# Patient Record
Sex: Female | Born: 1950 | Race: White | Hispanic: No | Smoking: Current every day smoker
Health system: Southern US, Community
[De-identification: ages and names within clinical notes are randomized; demographics above are authoritative.]

## PROBLEM LIST (undated history)

## (undated) DIAGNOSIS — F32A Depression, unspecified: Secondary | ICD-10-CM

## (undated) DIAGNOSIS — F419 Anxiety disorder, unspecified: Secondary | ICD-10-CM

## (undated) DIAGNOSIS — F329 Major depressive disorder, single episode, unspecified: Secondary | ICD-10-CM

---

## 2008-04-09 ENCOUNTER — Ambulatory Visit (HOSPITAL_COMMUNITY): Admission: RE | Admit: 2008-04-09 | Discharge: 2008-04-09 | Payer: Self-pay | Admitting: Specialist

## 2008-06-19 ENCOUNTER — Ambulatory Visit (HOSPITAL_BASED_OUTPATIENT_CLINIC_OR_DEPARTMENT_OTHER): Admission: RE | Admit: 2008-06-19 | Discharge: 2008-06-19 | Payer: Self-pay | Admitting: Specialist

## 2011-02-17 NOTE — Op Note (Signed)
[name], Danielle Villanueva                  ACCOUNT NO.:  [id number]   MEDICAL RECORD NO.:  [id number]          PATIENT TYPE:  AMB   LOCATION:  [facility]                         FACILITY:  [facility]   PHYSICIAN:  Jene Every, M.D.    DATE OF BIRTH:  [birth year]   DATE OF PROCEDURE:  06/19/2008  DATE OF DISCHARGE:                               OPERATIVE REPORT   PREOPERATIVE DIAGNOSIS:  Medial meniscal tear of the left knee.   POSTOPERATIVE DIAGNOSIS:  Medial meniscal tear of the left knee.   PROCEDURE PERFORMED:  Left knee arthroscopy, posterior medial meniscectomy.   ANESTHESIA:  General.   ASSISTANT:  None.   BRIEF HISTORY/INDICATIONS:  This is a 60 year old female who has had mechanical symptoms from a left  knee injury with a medial meniscal tear causing locking, popping, and  giving way.  She was indicated for a posterior medial meniscectomy.  Preoperatively, the MRI had indicated possible bone marrow changes;  however, she is a smoker and had a normal CBC and differential.  Risks  and benefits of the procedure were discussed, including bleeding,  infection, damage to neurovascular structures, DVT, PE, and anesthetic  complications.   TECHNIQUE:  With the patient in supine position after the induction of adequate  general anesthesia and 1 gm of Kefzol, the left lower extremity was  prepped and draped in the usual sterile fashion.  A lateral parapatellar  portal and a superior medial parapatellar portal was fashioned with a  #11 blade, egress cannula atraumatically placed.  Irrigant was utilized  to insufflate the joint.  Under direct visualization, a medial  parapatellar portal was fashioned with a #11 blade after localization  with an 18 gauge needle, sparing the medial meniscus.  Noted was a  displaced medial meniscal tear.  Basket rongeur was utilized to amputate  the meniscus, which was still attached anteriorly.  This was then  removed with a pituitary.  The remainder of the  meniscus was then  contoured with a 42 coude shaver and a straight basket rongeur to a  stable pace.  This was of the medial compartment and involved the inner  third of the meniscus back to the posterior portion of the meniscus.  The remnant was stable.  The cartilage was unremarkable.  ACL and PCL  were unremarkable.  All compartments revealed normal plateau, femoral  condyle, and normal meniscus, stable to probe palpation without evidence  of tear.  In the suprapatellar pouch, there was normal patellofemoral  tracking.  There was no significant arthrosis noted in the  patellofemoral joint.  The medial compartment was re-examined again.  The remnant of the meniscus was stable to probe palpation and then  flexion and extension.  There was no interposition between the femoral  condyle on the tibia with full range noted.   The knee was copiously lavaged, and all instrumentation was removed.  The portals were closed with 4-0 nylon with simple sutures.  Marcaine  0.25% with epinephrine was infiltrated in the joint, and the wound was  dressed sterilely.  She was awakened without difficulty and transported  to the recovery room in satisfactory condition.   Patient tolerated the procedure well with no complications.   ASSISTANT:  None.      Jene Every, M.D.  Electronically Signed     JB/MEDQ  D:  06/19/2008  T:  06/20/2008  Job:  [id number]

## 2011-05-26 ENCOUNTER — Emergency Department (HOSPITAL_COMMUNITY): Payer: Self-pay

## 2011-05-26 ENCOUNTER — Emergency Department (HOSPITAL_COMMUNITY)
Admission: EM | Admit: 2011-05-26 | Discharge: 2011-05-26 | Disposition: A | Discharge status: Home / Self Care | Payer: No Typology Code available for payment source | Attending: Emergency Medicine | Admitting: Emergency Medicine

## 2011-05-26 DIAGNOSIS — M25569 Pain in unspecified knee: Secondary | ICD-10-CM | POA: Insufficient documentation

## 2011-05-26 DIAGNOSIS — Y9229 Other specified public building as the place of occurrence of the external cause: Secondary | ICD-10-CM | POA: Insufficient documentation

## 2011-05-26 DIAGNOSIS — M25559 Pain in unspecified hip: Secondary | ICD-10-CM | POA: Insufficient documentation

## 2011-05-26 DIAGNOSIS — W010XXA Fall on same level from slipping, tripping and stumbling without subsequent striking against object, initial encounter: Secondary | ICD-10-CM | POA: Insufficient documentation

## 2011-05-26 DIAGNOSIS — M549 Dorsalgia, unspecified: Secondary | ICD-10-CM | POA: Insufficient documentation

## 2011-07-06 LAB — POCT HEMOGLOBIN-HEMACUE: Hemoglobin: 13.6

## 2012-12-31 IMAGING — CR DG PELVIS 1-2V
1 series · 1 of 1 positions shown · non-contrast
Comparison: None.

CLINICAL DATA: Fall, low back pain

PELVIS - 1-2 VIEW

[t pelvis ap]
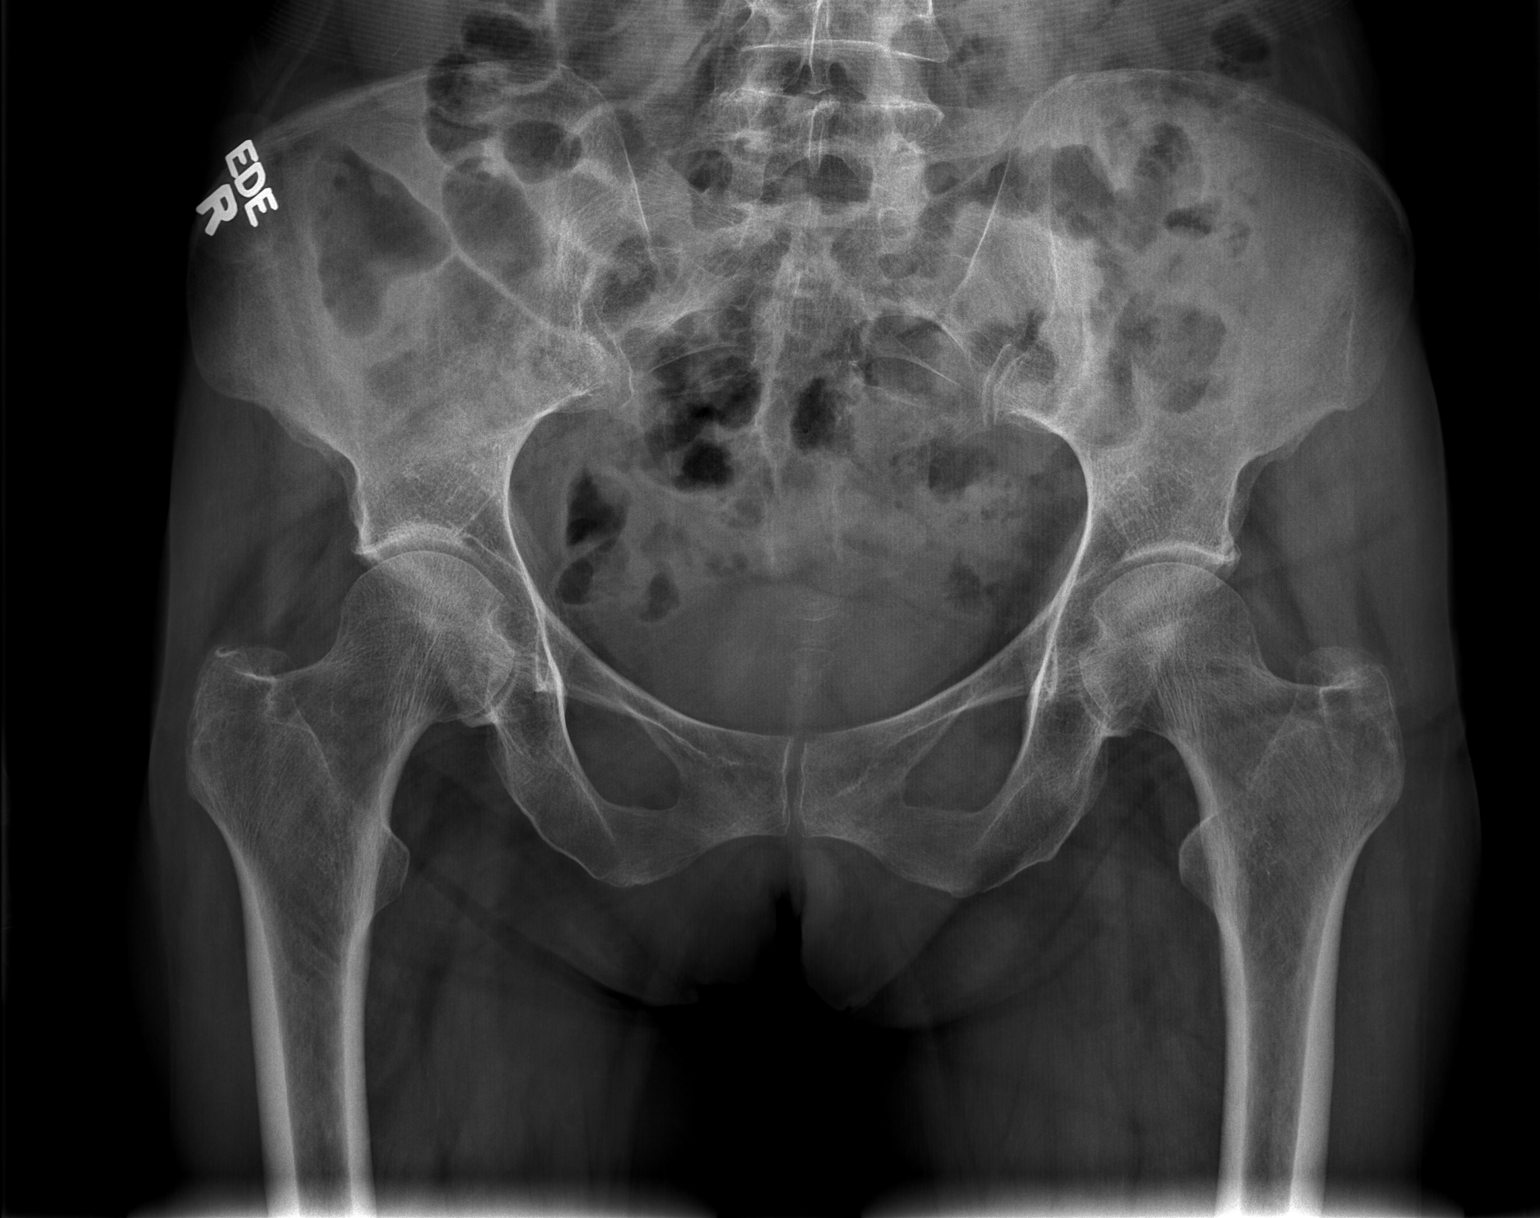

[1 of 1 positions shown; findings below may reference images not displayed]

FINDINGS: No fracture or dislocation is seen.

The bilateral hip joint spaces are symmetric.

Visualized lower lumbar spine appears unremarkable.
IMPRESSION: No fracture or dislocation is seen.

## 2012-12-31 IMAGING — CR DG LUMBAR SPINE COMPLETE 4+V
5 series · 5 of 5 positions shown · non-contrast
Comparison: None.

CLINICAL DATA: History of injury from fall with pain in the lower
back.

LUMBAR SPINE - COMPLETE 4+ VIEW

[t lumbar spine ap]
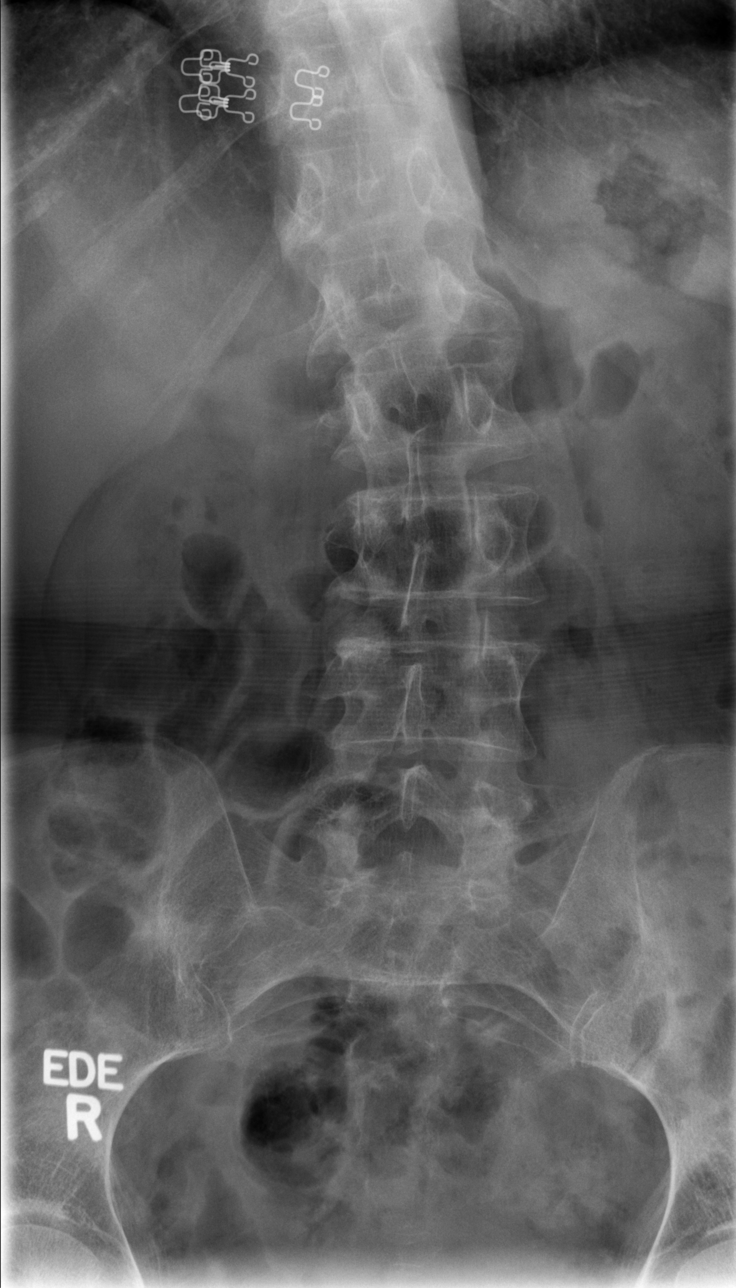

[t lumbar spine obl (1 of 2)]
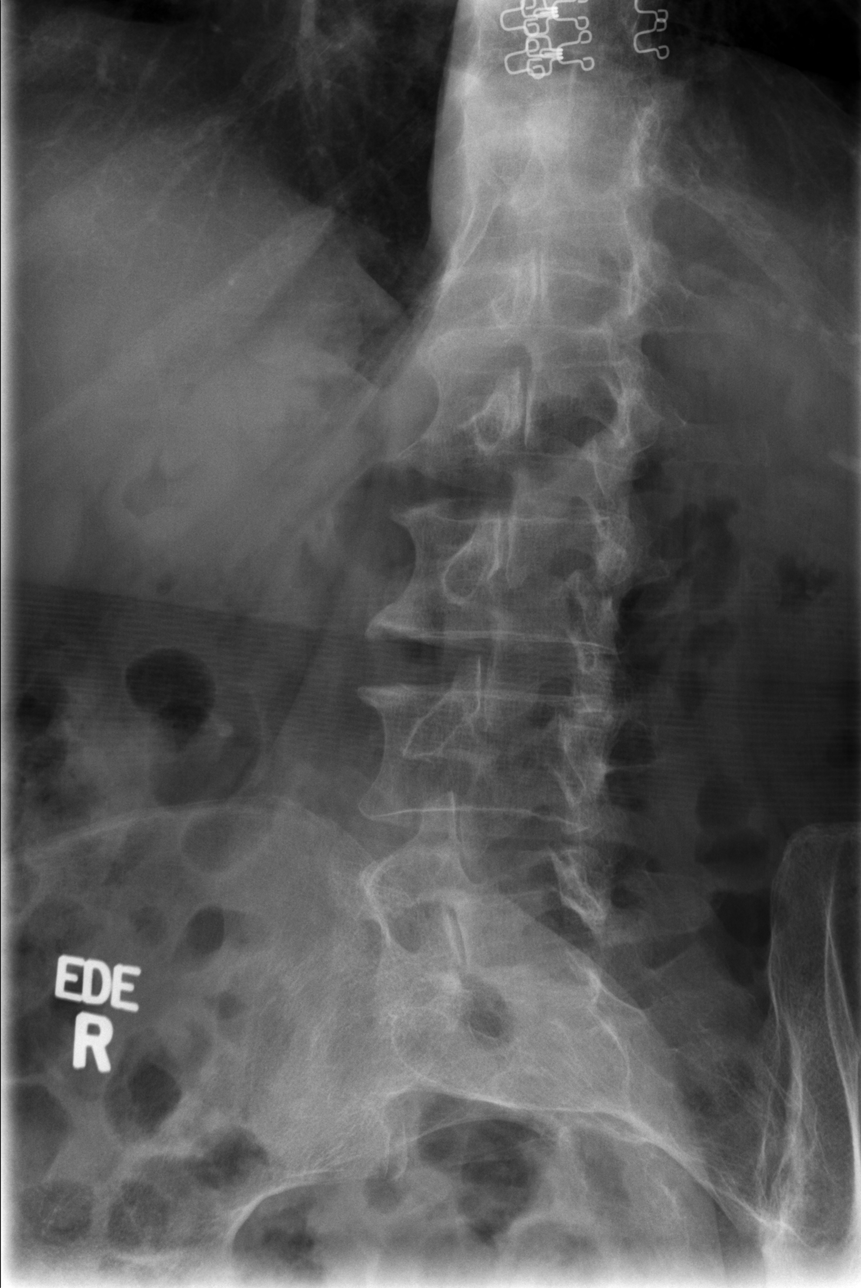

[t lumbar spine obl (2 of 2)]
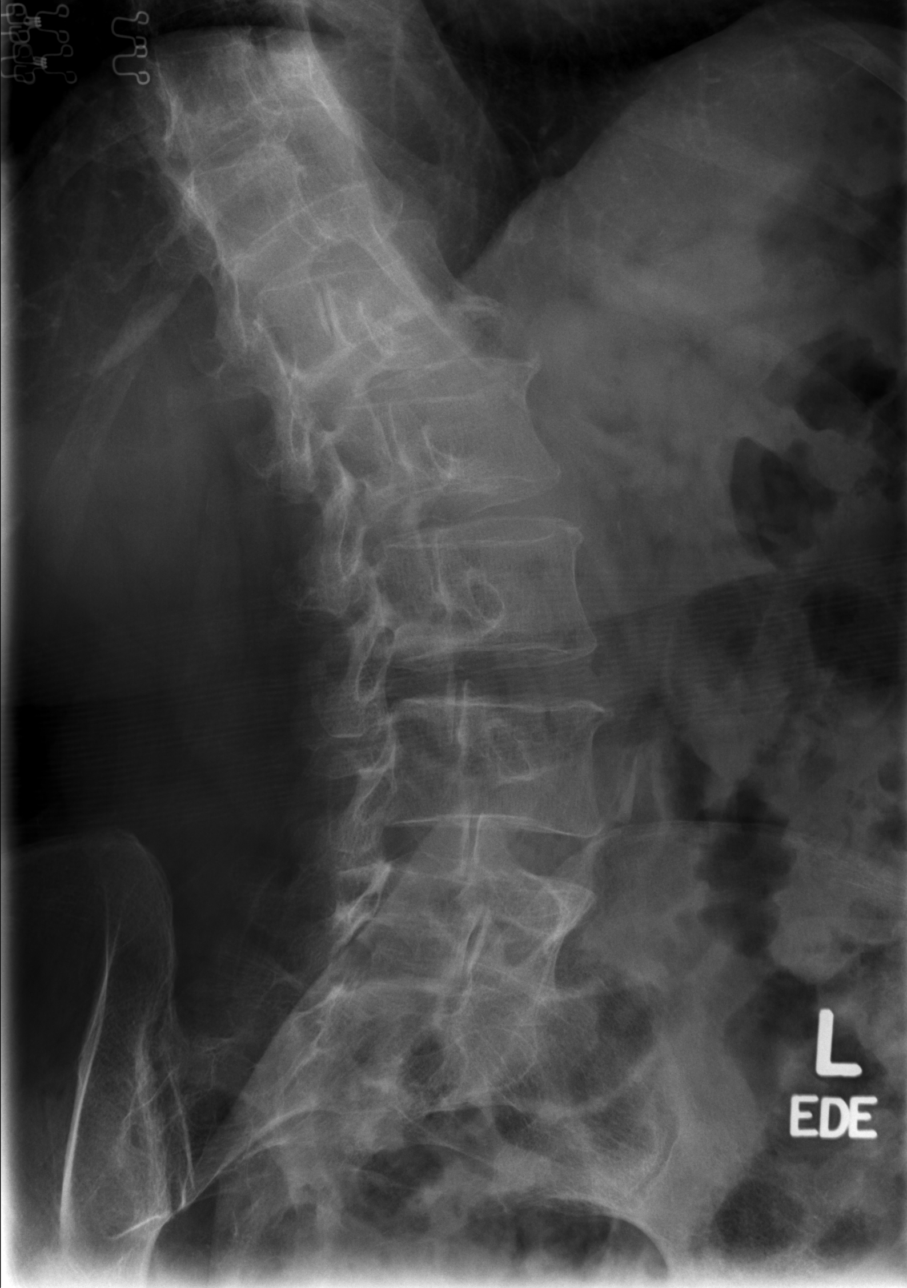

[t lumbar spine lat]
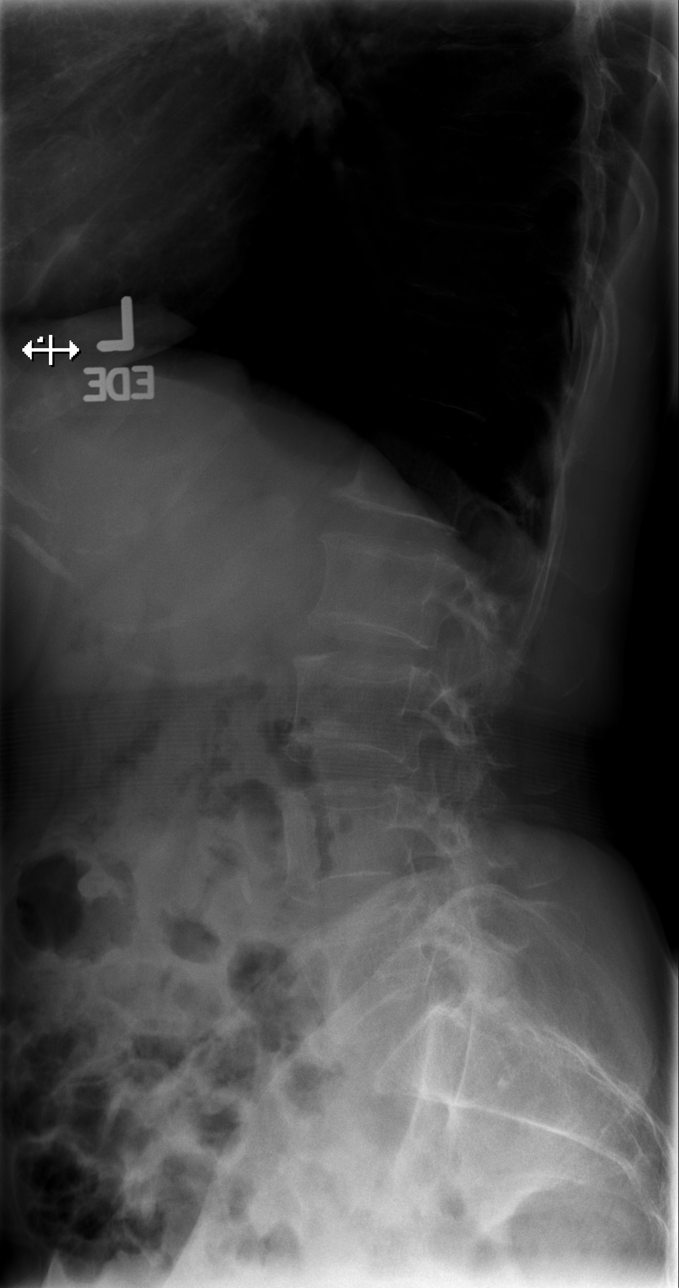

[t lumbar l-5 s-1 spot]
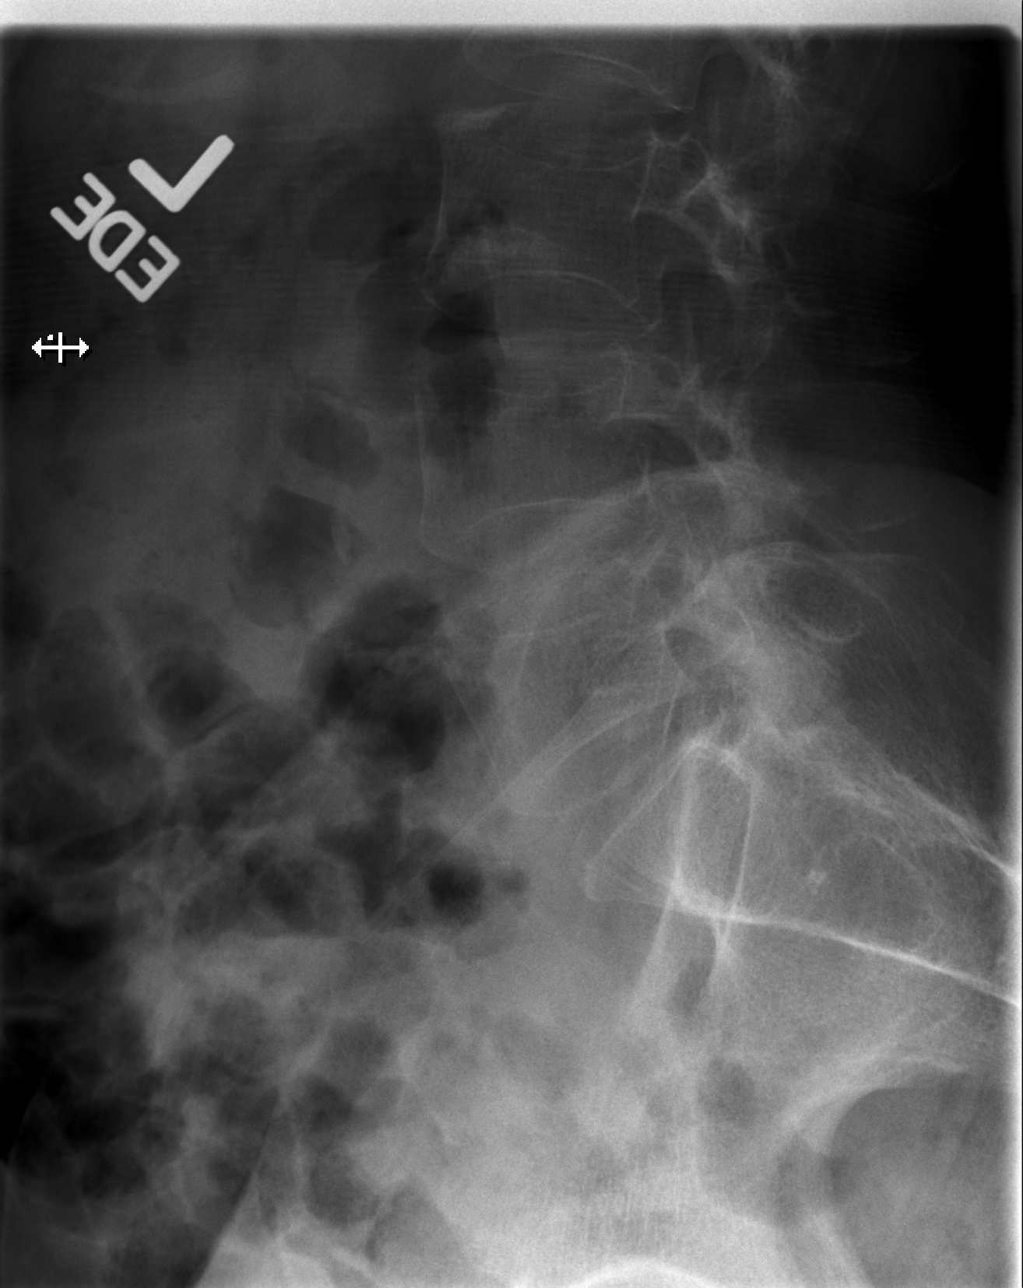

[5 of 5 positions shown; findings below may reference images not displayed]

FINDINGS: There are five non-rib bearing lumbar-type vertebral
bodies which are labeled L1-L5.  There is moderate scoliosis
convexity to the left.  There is an osteopenic appearance of the
bones.
No fracture, subluxation, pars defect, or bone destruction is seen.
Intervertebral disc spaces are maintained.  There is marginal
osteophyte formation representing degenerative spondylosis.  There
is apophyseal joint disease at L5-S1.  SI joints appear intact.
IMPRESSION: No fracture or dislocation is evident.  Scoliosis.  Degenerative
spondylosis.  Osteopenic appearance of bones.

## 2013-12-21 ENCOUNTER — Encounter (HOSPITAL_COMMUNITY): Payer: Self-pay | Admitting: Emergency Medicine

## 2013-12-21 ENCOUNTER — Emergency Department (HOSPITAL_COMMUNITY)
Admission: EM | Admit: 2013-12-21 | Discharge: 2013-12-22 | Disposition: A | Discharge status: Home / Self Care | Payer: Self-pay | Attending: Emergency Medicine | Admitting: Emergency Medicine

## 2013-12-21 DIAGNOSIS — F1020 Alcohol dependence, uncomplicated: Secondary | ICD-10-CM

## 2013-12-21 DIAGNOSIS — F3289 Other specified depressive episodes: Secondary | ICD-10-CM | POA: Insufficient documentation

## 2013-12-21 DIAGNOSIS — R45851 Suicidal ideations: Secondary | ICD-10-CM | POA: Insufficient documentation

## 2013-12-21 DIAGNOSIS — F32A Depression, unspecified: Secondary | ICD-10-CM

## 2013-12-21 DIAGNOSIS — F17200 Nicotine dependence, unspecified, uncomplicated: Secondary | ICD-10-CM | POA: Insufficient documentation

## 2013-12-21 DIAGNOSIS — F329 Major depressive disorder, single episode, unspecified: Secondary | ICD-10-CM

## 2013-12-21 LAB — RAPID URINE DRUG SCREEN, HOSP PERFORMED
Amphetamines: NOT DETECTED
Barbiturates: NOT DETECTED
Benzodiazepines: NOT DETECTED
Cocaine: NOT DETECTED
Opiates: NOT DETECTED
Tetrahydrocannabinol: NOT DETECTED

## 2013-12-21 LAB — SALICYLATE LEVEL: Salicylate Lvl: <2 mg/dL — ABNORMAL LOW (ref 2.8–20.0)

## 2013-12-21 LAB — CBC
HCT: 39.1 % (ref 36.0–46.0)
Hemoglobin: 13.5 g/dL (ref 12.0–15.0)
MCH: 32.5 pg (ref 26.0–34.0)
MCHC: 34.5 g/dL (ref 30.0–36.0)
MCV: 94 fL (ref 78.0–100.0)
Platelets: 283 10*3/uL (ref 150–400)
RBC: 4.16 MIL/uL (ref 3.87–5.11)
RDW: 14.6 % (ref 11.5–15.5)
WBC: 8.4 10*3/uL (ref 4.0–10.5)

## 2013-12-21 LAB — COMPREHENSIVE METABOLIC PANEL
ALT: 88 U/L — ABNORMAL HIGH (ref 0–35)
AST: 123 U/L — ABNORMAL HIGH (ref 0–37)
Albumin: 3.8 g/dL (ref 3.5–5.2)
Alkaline Phosphatase: 86 U/L (ref 39–117)
BUN: 6 mg/dL (ref 6–23)
CO2: 23 mEq/L (ref 19–32)
Calcium: 9.6 mg/dL (ref 8.4–10.5)
Chloride: 94 mEq/L — ABNORMAL LOW (ref 96–112)
Creatinine, Ser: 0.47 mg/dL — ABNORMAL LOW (ref 0.50–1.10)
GFR calc Af Amer: >90 mL/min (ref >90)
GFR calc non Af Amer: >90 mL/min (ref >90)
Glucose, Bld: 99 mg/dL (ref 70–99)
Potassium: 4.2 mEq/L (ref 3.7–5.3)
Sodium: 135 mEq/L — ABNORMAL LOW (ref 137–147)
Total Bilirubin: <0.2 mg/dL — ABNORMAL LOW (ref 0.3–1.2)
Total Protein: 6.8 g/dL (ref 6.0–8.3)

## 2013-12-21 LAB — ETHANOL: Alcohol, Ethyl (B): 238 mg/dL — ABNORMAL HIGH (ref 0–11)

## 2013-12-21 LAB — ACETAMINOPHEN LEVEL: Acetaminophen (Tylenol), Serum: <15 ug/mL (ref 10–30)

## 2013-12-21 MED ORDER — ACETAMINOPHEN 325 MG PO TABS
650.0000 mg | ORAL_TABLET | ORAL | Status: DC | PRN
  Administered 2013-12-21 – 2013-12-22 (×3): 650 mg via ORAL
  Filled 2013-12-21 (×3): qty 2

## 2013-12-21 MED ORDER — ALUM & MAG HYDROXIDE-SIMETH 200-200-20 MG/5ML PO SUSP: 30.0000 mL | ORAL | Status: DC | PRN

## 2013-12-21 MED ORDER — ONDANSETRON HCL 4 MG PO TABS: 4.0000 mg | ORAL_TABLET | Freq: Three times a day (TID) | ORAL | Status: DC | PRN

## 2013-12-21 MED ORDER — LORAZEPAM 1 MG PO TABS
1.0000 mg | ORAL_TABLET | Freq: Three times a day (TID) | ORAL | Status: DC | PRN
  Filled 2013-12-21: qty 1

## 2013-12-21 MED ORDER — ZOLPIDEM TARTRATE 5 MG PO TABS
5.0000 mg | ORAL_TABLET | Freq: Every evening | ORAL | Status: DC | PRN
  Filled 2013-12-21: qty 1

## 2013-12-21 MED ORDER — NICOTINE 21 MG/24HR TD PT24
21.0000 mg | MEDICATED_PATCH | Freq: Every day | TRANSDERMAL | Status: DC
  Administered 2013-12-21: 21 mg via TRANSDERMAL
  Filled 2013-12-21: qty 1

## 2013-12-21 NOTE — ED Notes (Signed)
Patient initially anxious, guarded upon arrival on the unit. Patient stated "[place] law states that if I signed myself in I can sign myself out" "I'm ready to leave". Patient appeared anxious, agitated. Encouragement offered. Notified TTS of patient's statements. Patient allowed to call her son. Appeared to calm down. Remained brief with Clinical research associatewriter with minimal contact. "I'm not trying to be difficult but this (place) is worse than suicide". Patient hesitant to answer SI. After pause stated she stated that no is the proper answer. Denies HI, AVH. Reports that she needs a job and that she will have no home after the 28th and that "sitting here" is not going to help her. "I need a job".   Encouragement offered.   Patient with TTS.  Given nicotine patch.  Patient safety maintained, Q 15 checks in place.

## 2013-12-21 NOTE — ED Notes (Signed)
PT states that she has hit bottom and needs help; pt c/o feeling suicidal x 2 months; pt states that she has multiple ways to do it when asked about a plan for SI; pt tearful and crying states "I just need help"

## 2013-12-21 NOTE — ED Provider Notes (Signed)
CSN: [id number]     Arrival date & time 12/21/13  2053 History  This chart was scribed for Junius Finner, PA working with Merrie Roof, MD by Quintella Reichert, ED Scribe. This patient was seen in room [facility] and the patient's care was started at 9:34 PM.   Chief Complaint  Patient presents with  . Medical Clearance    The history is provided by the patient. No language interpreter was used.    HPI Comments: Danielle Villanueva is a 63 y.o. female who presents to the Emergency Department complaining of SI.  Pt states she has been feeling suicidal for about the past 2 months.  She does not specify any plan but states that she has multiple ways to do it.  She denies access to lethal weapons at home.  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  She denies any chronic medical conditions or regular medication usage.    History reviewed. No pertinent past medical history.  History reviewed. No pertinent past surgical history.  No family history on file.   History  Substance Use Topics  . Smoking status: Current Every Day Smoker -- 1.50 packs/day    Types: Cigarettes  . Smokeless tobacco: Not on file  . Alcohol Use: No    OB History   Grav Para Term Preterm Abortions TAB SAB Ect Mult Living                   Review of Systems  Psychiatric/Behavioral: Positive for suicidal ideas and dysphoric mood.  All other systems reviewed and are negative.      Allergies  Asa  Home Medications   Current Outpatient Rx  Name  Route  Sig  Dispense  Refill  . ibuprofen (ADVIL,MOTRIN) 200 MG tablet   Oral   Take 400 mg by mouth every 6 (six) hours as needed for moderate pain.           BP 108/71  Pulse 84  Resp 16  SpO2 95%  Physical Exam  Nursing note and vitals reviewed. Constitutional: She is oriented to person, place, and time. She appears  well-developed and well-nourished.  Flat affect.  Pt wearing torn dirty clothes.  HENT:  Head: Normocephalic and atraumatic.  Eyes: EOM are normal.  Neck: Normal range of motion.  Cardiovascular: Normal rate.   Pulmonary/Chest: Effort normal.  Musculoskeletal: Normal range of motion.  Neurological: She is alert and oriented to person, place, and time.  Skin: Skin is warm and dry.  Psychiatric: She exhibits a depressed mood. She expresses suicidal ideation. She expresses no homicidal ideation. She expresses suicidal plans (multiple).    ED Course  Procedures (including critical care time)  DIAGNOSTIC STUDIES: Oxygen Saturation is 95% on room air, adequate by my interpretation.    COORDINATION OF CARE: 9:39 PM-Discussed treatment plan which includes labs with pt at bedside and pt agreed to plan.     Labs Review Labs Reviewed  COMPREHENSIVE METABOLIC PANEL - Abnormal; Notable for the following:    Sodium 135 (*)    Chloride 94 (*)    Creatinine, Ser 0.47 (*)    AST 123 (*)    ALT 88 (*)    Total Bilirubin <0.2 (*)    All other components within normal limits  ETHANOL - Abnormal; Notable for the following:    Alcohol, Ethyl (B)  238 (*)    All other components within normal limits  SALICYLATE LEVEL - Abnormal; Notable for the following:    Salicylate Lvl <2.0 (*)    All other components within normal limits  ACETAMINOPHEN LEVEL  CBC  URINE RAPID DRUG SCREEN (HOSP PERFORMED)    Imaging Review No results found.   EKG Interpretation None      MDM   Final diagnoses:  None    Pt presenting to ED c/o SI x2132mo. Denies HI. Pt is poor historian. States there are "multiple ways to do it [kill herself]"  Pt states she "just needs help"  Pt is medically cleared to be evaluated by TTS.  [facility]  I personally performed the services described in this documentation, which was scribed in my presence. The recorded information has been reviewed and is  accurate.    Junius FinnerErin O'Malley, PA-C 12/22/13 [id number]

## 2013-12-21 NOTE — ED Notes (Signed)
Patient labile. Anxious, agitated. Asking for TTS Marcus. Wants to leave for a cigarette. States "I'm a good girl" "God doesn't like liars". Offered Ativan for anxiety or Ambien for rest. Patient refused. Insisting on cigarette and having Berna SpareMarcus paged to find out about his talk with the doctor.  Patient reminded of smoking rules and asked to wait on response from [place], TTS.  Safety maintained, Q 15 checks continue.

## 2013-12-22 DIAGNOSIS — F4321 Adjustment disorder with depressed mood: Secondary | ICD-10-CM

## 2013-12-22 DIAGNOSIS — F1020 Alcohol dependence, uncomplicated: Secondary | ICD-10-CM

## 2013-12-22 NOTE — ED Notes (Signed)
Pt up to nurses station. Pt states she wants to go home. It was explained the doctor would come in and reassess her and that would be up to him. Pt back in room.

## 2013-12-22 NOTE — BH Assessment (Signed)
Assessment Note  Danielle Villanueva is an 63 y.o. female.  -Clinician talked to Danielle Villanueva, the PA about patient suicidality.  Danielle Villanueva said that patient had said "there are many ways to kill yourself."  Danielle Villanueva said that if patient acted like she wanted to leave she should be IVC'ed.  Patient initially did not want to speak much.  When asked if she had a plan to kill herself patient said no but that she had been thinking about it for a couple of months.  Patient denies HI or A/V hallucinations.  Patient has no previous inpatient care.  Patient is depressed and said that she wants help.  She said that she has been out of a job for the last 4 years.  She is homeless and has no car.  Patient is worried because she has her belongings in storage and has to get it all out on 03/28.  Patient says that she is depressed about her current life situation.  Her mother and her son will not help her she says.  Has a daughter but the daughter has a drug problem.  Pt is hopeless and leans on her church for support.  Even that she says is running out.  Patient became agitated about wanting to go outside to have a cigarette.  Clinician told her this would not be done because of her having to be re-registered, etc.  Patient was given information on the IVC process.  She said that she wanted help and clinician told her that she would be seen by psychiatrist in the AM.    -Clinician talked to Danielle SamFran Hobson, NP who agreed that patient needed to be seen by psychiatrist in the AM on 03/20.  Clinician talked to Danielle Villanueva, [place] and let her know about this also.   Axis I: Depressive Disorder NOS Axis II: Deferred Axis III: History reviewed. No pertinent past medical history. Axis IV: economic problems, housing problems, occupational problems and problems with primary support group Axis V: 41-50 serious symptoms  Past Medical History: History reviewed. No pertinent past medical history.  History reviewed. No pertinent past surgical  history.  Family History: No family history on file.  Social History:  reports that she has been smoking Cigarettes.  She has been smoking about 1.50 packs per day. She does not have any smokeless tobacco history on file. She reports that she does not drink alcohol or use illicit drugs.  Additional Social History:  Alcohol / Drug Use Pain Medications: None Prescriptions: None Over the Counter: None History of alcohol / drug use?: Yes Substance #1 Name of Substance 1: ETOH (wine) 1 - Age of First Use: Teens 1 - Amount (size/oz): 1-2 glasses 1 - Frequency: Daily 1 - Duration: Ongoing 1 - Last Use / Amount: Last drank wine in November.  Drank beer in February.  CIWA: CIWA-Ar BP: 108/71 mmHg Pulse Rate: 84 COWS:    Allergies:  Allergies  Allergen Reactions  . Asa [Aspirin] Nausea And Vomiting    Home Medications:  (Not in a hospital admission)  OB/GYN Status:  No LMP recorded. Patient is postmenopausal.  General Assessment Data Location of Assessment: [facility] Is this a Tele or Face-to-Face Assessment?: Face-to-Face Is this an Initial Assessment or a Re-assessment for this encounter?: Initial Assessment Living Arrangements: Other (Comment) (Pt is homeless) Can pt return to current living arrangement?: Yes Admission Status: Voluntary Is patient capable of signing voluntary admission?: Yes Transfer from: Acute Hospital Referral Source: Self[facility]Family/Friend     [facility]  Crisis Care Plan Living Arrangements: Other (Comment) (Pt is homeless) Name of Psychiatrist: None Name of Therapist: None     Risk to self Suicidal Ideation: Yes-Currently Present Suicidal Intent: Yes-Currently Present Is patient at risk for suicide?: Yes Suicidal Plan?: No (Pt says "there are many ways to do it.") Access to Means:  ([place]) What has been your use of drugs/alcohol within the last 12 months?: Regular use of ETOH Previous Attempts/Gestures: No How many times?: 0 Other Self Harm Risks:  None Triggers for Past Attempts: None known Intentional Self Injurious Behavior: None Family Suicide History: No Recent stressful life event(s): Financial Problems;Job Loss (Homeless) Persecutory voices/beliefs?: No Depression: Yes Depression Symptoms: Despondent;Isolating;Guilt;Loss of interest in usual pleasures;Feeling worthless/self pity;Feeling angry/irritable Substance abuse history and/or treatment for substance abuse?: No Suicide prevention information given to non-admitted patients: Not applicable  Risk to Others Homicidal Ideation: No Thoughts of Harm to Others: No Current Homicidal Intent: No Current Homicidal Plan: No Access to Homicidal Means: No Identified Victim: No one History of harm to others?: No Assessment of Violence: None Noted Violent Behavior Description: None Does patient have access to weapons?: No Criminal Charges Pending?: No Does patient have a court date: No  Psychosis Hallucinations: None noted Delusions: None noted  Mental Status Report Appear/Hygiene: Disheveled Eye Contact: Good Motor Activity: Agitation Speech: Logical/coherent;Argumentative Level of Consciousness: Alert Mood: Depressed;Anxious Affect: Anxious;Apprehensive;Depressed;Sullen Anxiety Level: Severe Thought Processes: Coherent;Relevant Judgement: Unimpaired Orientation: Person;Place;Time;Situation Obsessive Compulsive Thoughts/Behaviors: Minimal  Cognitive Functioning Concentration: Normal Memory: Recent Intact;Remote Intact IQ: Average Insight: Fair Impulse Control: Fair Appetite: Poor Weight Loss:  (Has not had money to buy food.) Weight Gain: 0 Sleep: No Change Total Hours of Sleep: 6 Vegetative Symptoms: None  ADLScreening [facility] Assessment Services) Patient's cognitive ability adequate to safely complete daily activities?: Yes Patient able to express need for assistance with ADLs?: Yes Independently performs ADLs?: Yes (appropriate for developmental  age)  Prior Inpatient Therapy Prior Inpatient Therapy: No Prior Therapy Dates: None Prior Therapy Facilty/Provider(s): N/A Reason for Treatment: N/A  Prior Outpatient Therapy Prior Outpatient Therapy: No Prior Therapy Dates: N/A Prior Therapy Facilty/Provider(s): N/A Reason for Treatment: Nine  ADL Screening (condition at time of admission) Patient's cognitive ability adequate to safely complete daily activities?: Yes Is the patient deaf or have difficulty hearing?: No Does the patient have difficulty seeing, even when wearing glasses/contacts?: No Does the patient have difficulty concentrating, remembering, or making decisions?: No Patient able to express need for assistance with ADLs?: Yes Does the patient have difficulty dressing or bathing?: No Independently performs ADLs?: Yes (appropriate for developmental age) Does the patient have difficulty walking or climbing stairs?: No Weakness of Legs: None       Abuse/Neglect Assessment (Assessment to be complete while patient is alone) Physical Abuse: Denies Verbal Abuse: Denies Sexual Abuse: Denies Exploitation of patient/patient's resources: Denies Self-Neglect: Denies Values / Beliefs Cultural Requests During Hospitalization: None Spiritual Requests During Hospitalization: None   Advance Directives (For Healthcare) Advance Directive: Patient does not have advance directive;Patient would not like information    Additional Information 1:1 In Past 12 Months?: No CIRT Risk: No Elopement Risk: Yes Does patient have medical clearance?: Yes     Disposition:  Disposition Initial Assessment Completed for this Encounter: Yes Disposition of Patient: Referred to Patient referred to: Other (Comment) (Psychiatrist to see in AM on 03/20.)  On Site Evaluation by:   Reviewed with Physician:    Beatriz Stallion Ray 12/22/2013 2:40 AM

## 2013-12-22 NOTE — Progress Notes (Signed)
Per psychiatrist, patient psychiatrically stable for discharge home. CSW provided patient with outpatient resources including mobile crisis. Patient states she plans to follow up with counseling at her church.  Byrd HesselbachKristen Oriyah Lamphear, LCSW [phone]  ED CSW 12/22/2013 1445pm

## 2013-12-22 NOTE — Progress Notes (Addendum)
CSW called to obtain collateral information from pt son, Danielle Villanueva. Per son, pt has a history of alcoholism, and always denied. Pt son shared she is living [place] [address], and hasn't held a job in at least 7 or 8 years. Patient son shares that when she starts to drink she's very argumentative and combative. Pt son, states that if patient is able to get in to a rehab place then family could support. However until then patient family unable to support. Pt son states he could send a taxi or uber to provide transport.   Byrd HesselbachKristen Aleza Pew, LCSW [phone]  ED CSW 12/22/2013   CSW informed MD.   Olga CoasterKristen Chasyn Cinque, LCSW [phone]  ED CSW 12/22/2013 1240pm

## 2013-12-22 NOTE — [facility] Suicide Risk Assessment (Signed)
Suicide Risk Assessment  Discharge Assessment     Demographic Factors:  Female  Total Time spent with patient: 45 minutes  Psychiatric Specialty Exam:     Blood pressure 129/76, pulse 83, temperature 98.3 F (36.8 C), temperature source Oral, resp. rate 16, SpO2 96.00%.There is no height or weight on file to calculate BMI.  General Appearance: Casual  Eye Contact::  Fair  Speech:  Slow  Volume:  Decreased  Mood:  Euthymic  Affect:  Constricted  Thought Process:  Coherent and Linear  Orientation:  Full (Time, Place, and Person)  Thought Content:  Rumination  Suicidal Thoughts:  No  Homicidal Thoughts:  No  Memory:  Recent;   Fair  Judgement:  Fair  Insight:  Shallow  Psychomotor Activity:  Decreased  Concentration:  Fair  Recall:  FiservFair  Fund of Knowledge:Fair  Language: Fair  Akathisia:  Negative  Handed:  Right  AIMS (if indicated):     Assets:  Desire for Improvement  Sleep:       Musculoskeletal: Strength & Muscle Tone: within normal limits Gait & Station: normal Patient leans: Front   Mental Status Per Nursing Assessment::   On Admission:     Current Mental Status by Physician: see MSE  Loss Factors: Decrease in vocational status  Historical Factors: Impulsivity  Risk Reduction Factors:   Sense of responsibility to family  Continued Clinical Symptoms:  Alcohol/Substance Abuse/Dependencies  Cognitive Features That Contribute To Risk:  Closed-mindedness    Suicide Risk:  Minimal: No identifiable suicidal ideation.  Patients presenting with no risk factors but with morbid ruminations; may be classified as minimal risk based on the severity of the depressive symptoms  Discharge Diagnoses:   AXIS I:  Alcohol Abuse, Substance Induced Mood Disorder and Alcohol use disorder.Depressive disorder, unspecified AXIS II:  Deferred AXIS III:  History reviewed. No pertinent past medical history. AXIS IV:  economic problems, other psychosocial or  environmental problems and problems with primary support group AXIS V:  51-60 moderate symptoms  Plan Of Care/Follow-up recommendations:  Activity:  as tolerated Diet:  regular Discharge home. Follow up with AA and rehab. Outpatient services. Is patient on multiple antipsychotic therapies at discharge:  No   Has Patient had three or more failed trials of antipsychotic monotherapy by history:  No  Recommended Plan for Multiple Antipsychotic Therapies: NA    Gilmore LarocheAKHTAR, Kareli Hossain MD 12/22/2013, 2:11 PM

## 2013-12-22 NOTE — ED Provider Notes (Signed)
Medical screening examination/treatment/procedure(s) were performed by non-physician practitioner and as supervising physician I was immediately available for consultation/collaboration.   Shamecca Whitebread David Thoren Hosang III, MD 12/22/13 1320 

## 2013-12-22 NOTE — Consult Note (Signed)
[facility] Face-to-Face Psychiatry Consult   Reason for Consult:  depression Referring Physician:  ED physician  Danielle Villanueva is an 63 y.o. female. Total Time spent with patient: 45 minutes  Assessment: AXIS I:  Depressive disorder, unspecified. Adjustment disorder with depressed mood. Alcohol use disorder, moderate AXIS II:  Deferred AXIS III:  History reviewed. No pertinent past medical history. AXIS IV:  occupational problems, other psychosocial or environmental problems and problems related to social environment AXIS V:  51-60 moderate symptoms  Plan:  No evidence of imminent risk to self or others at present.   Patient does not meet criteria for psychiatric inpatient admission. Supportive therapy provided about ongoing stressors. Discussed crisis plan, support from social network, calling 911, coming to the Emergency Department, and calling Suicide Hotline.  Subjective:   Danielle Villanueva is a 63 y.o. female patient admitted with depression.  HPI:  Patient presented with depression initially says there are many ways to kill. Now feeling better and denies having any plans. Says she is having issues with housing and other social issues but would not harm herself. Son contacted by [place] and he was informed she is doing better and wants to follow up with outpatient. Patient denies any psychotic symptoms. Says she has not been a regular drinker, altough son mentions that they have tried getting help regarding her alcohol but she does not follow through. Vitals are normal and she is not endorsing anxiety or confusion.   HPI Elements:   Location:  depression. Quality:  moderate. Severity:  recurrent.  Past Psychiatric History: History reviewed. No pertinent past medical history.  reports that she has been smoking Cigarettes.  She has been smoking about 1.50 packs per day. She does not have any smokeless tobacco history on file. She reports that she does not drink alcohol or use illicit  drugs. No family history on file. Family History Substance Abuse: Yes, Describe: (A daughter has heroin & cocaine abuse problems.) Family Supports: Yes, List: (Son and mother do not help her.) Living Arrangements: Other (Comment) (Pt is homeless) Can pt return to current living arrangement?: Yes Abuse/Neglect [facility]) Physical Abuse: Denies Verbal Abuse: Denies Sexual Abuse: Denies Allergies:   Allergies  Allergen Reactions  . Asa [Aspirin] Nausea And Vomiting    ACT Assessment Complete:  Yes:    Educational Status    Risk to Self: Risk to self Suicidal Ideation: Yes-Currently Present Suicidal Intent: Yes-Currently Present Is patient at risk for suicide?: Yes Suicidal Plan?: No (Pt says "there are many ways to do it.") Access to Means:  ([place]) What has been your use of drugs/alcohol within the last 12 months?: Regular use of ETOH Previous Attempts/Gestures: No How many times?: 0 Other Self Harm Risks: None Triggers for Past Attempts: None known Intentional Self Injurious Behavior: None Family Suicide History: No Recent stressful life event(s): Financial Problems;Job Loss (Homeless) Persecutory voices/beliefs?: No Depression: Yes Depression Symptoms: Despondent;Isolating;Guilt;Loss of interest in usual pleasures;Feeling worthless/self pity;Feeling angry/irritable Substance abuse history and/or treatment for substance abuse?: Yes Suicide prevention information given to non-admitted patients: Not applicable  Risk to Others: Risk to Others Homicidal Ideation: No Thoughts of Harm to Others: No Current Homicidal Intent: No Current Homicidal Plan: No Access to Homicidal Means: No Identified Victim: No one History of harm to others?: No Assessment of Violence: None Noted Violent Behavior Description: None Does patient have access to weapons?: No Criminal Charges Pending?: No Does patient have a court date: No  Abuse: Abuse/Neglect Assessment (Assessment to be complete  while  patient is alone) Physical Abuse: Denies Verbal Abuse: Denies Sexual Abuse: Denies Exploitation of patient/patient's resources: Denies Self-Neglect: Denies  Prior Inpatient Therapy: Prior Inpatient Therapy Prior Inpatient Therapy: No Prior Therapy Dates: None Prior Therapy Facilty/Provider(s): N/A Reason for Treatment: N/A  Prior Outpatient Therapy: Prior Outpatient Therapy Prior Outpatient Therapy: No Prior Therapy Dates: N/A Prior Therapy Facilty/Provider(s): N/A Reason for Treatment: Nine  Additional Information: Additional Information 1:1 In Past 12 Months?: No CIRT Risk: No Elopement Risk: Yes Does patient have medical clearance?: Yes                  Objective: Blood pressure 129/76, pulse 83, temperature 98.3 F (36.8 C), temperature source Oral, resp. rate 16, SpO2 96.00%.There is no height or weight on file to calculate BMI. Results for orders placed during the hospital encounter of 12/21/13 (from the past 72 hour(s))  ACETAMINOPHEN LEVEL     Status: None   Collection Time    12/21/13  9:48 PM      Result Value Ref Range   Acetaminophen (Tylenol), Serum <15.0  10 - 30 ug/mL   Comment:            THERAPEUTIC CONCENTRATIONS VARY     SIGNIFICANTLY. A RANGE OF 10-30     ug/mL MAY BE AN EFFECTIVE     CONCENTRATION FOR MANY PATIENTS.     HOWEVER, SOME ARE BEST TREATED     AT CONCENTRATIONS OUTSIDE THIS     RANGE.     ACETAMINOPHEN CONCENTRATIONS     >150 ug/mL AT 4 HOURS AFTER     INGESTION AND >50 ug/mL AT 12     HOURS AFTER INGESTION ARE     OFTEN ASSOCIATED WITH TOXIC     REACTIONS.  CBC     Status: None   Collection Time    12/21/13  9:48 PM      Result Value Ref Range   WBC 8.4  4.0 - 10.5 K/uL   RBC 4.16  3.87 - 5.11 MIL/uL   Hemoglobin 13.5  12.0 - 15.0 g/dL   HCT 39.1  36.0 - 46.0 %   MCV 94.0  78.0 - 100.0 fL   MCH 32.5  26.0 - 34.0 pg   MCHC 34.5  30.0 - 36.0 g/dL   RDW 14.6  11.5 - 15.5 %   Platelets 283  150 - 400 K/uL   COMPREHENSIVE METABOLIC PANEL     Status: Abnormal   Collection Time    12/21/13  9:48 PM      Result Value Ref Range   Sodium 135 (*) 137 - 147 mEq/L   Potassium 4.2  3.7 - 5.3 mEq/L   Chloride 94 (*) 96 - 112 mEq/L   CO2 23  19 - 32 mEq/L   Glucose, Bld 99  70 - 99 mg/dL   BUN 6  6 - 23 mg/dL   Creatinine, Ser 0.47 (*) 0.50 - 1.10 mg/dL   Calcium 9.6  8.4 - 10.5 mg/dL   Total Protein 6.8  6.0 - 8.3 g/dL   Albumin 3.8  3.5 - 5.2 g/dL   AST 123 (*) 0 - 37 U/L   ALT 88 (*) 0 - 35 U/L   Alkaline Phosphatase 86  39 - 117 U/L   Total Bilirubin <0.2 (*) 0.3 - 1.2 mg/dL   GFR calc non Af Amer >90  >90 mL/min   GFR calc Af Amer >90  >90 mL/min   Comment: (NOTE)  The eGFR has been calculated using the CKD EPI equation.     This calculation has not been validated in all clinical situations.     eGFR's persistently <90 mL/min signify possible Chronic Kidney     Disease.  ETHANOL     Status: Abnormal   Collection Time    12/21/13  9:48 PM      Result Value Ref Range   Alcohol, Ethyl (B) 238 (*) 0 - 11 mg/dL   Comment:            LOWEST DETECTABLE LIMIT FOR     SERUM ALCOHOL IS 11 mg/dL     FOR MEDICAL PURPOSES ONLY  SALICYLATE LEVEL     Status: Abnormal   Collection Time    12/21/13  9:48 PM      Result Value Ref Range   Salicylate Lvl <4.1 (*) 2.8 - 20.0 mg/dL  URINE RAPID DRUG SCREEN (HOSP PERFORMED)     Status: None   Collection Time    12/21/13 10:02 PM      Result Value Ref Range   Opiates NONE DETECTED  NONE DETECTED   Cocaine NONE DETECTED  NONE DETECTED   Benzodiazepines NONE DETECTED  NONE DETECTED   Amphetamines NONE DETECTED  NONE DETECTED   Tetrahydrocannabinol NONE DETECTED  NONE DETECTED   Barbiturates NONE DETECTED  NONE DETECTED   Comment:            DRUG SCREEN FOR MEDICAL PURPOSES     ONLY.  IF CONFIRMATION IS NEEDED     FOR ANY PURPOSE, NOTIFY LAB     WITHIN 5 DAYS.                LOWEST DETECTABLE LIMITS     FOR URINE DRUG SCREEN     Drug Class        Cutoff (ng/mL)     Amphetamine      1000     Barbiturate      200     Benzodiazepine   324     Tricyclics       401     Opiates          300     Cocaine          300     THC              50   Labs are reviewed and are pertinent for high alcohol level.  Current Facility-Administered Medications  Medication Dose Route Frequency Provider Last Rate Last Dose  . acetaminophen (TYLENOL) tablet 650 mg  650 mg Oral Q4H PRN Noland Fordyce, PA-C   650 mg at 12/22/13 1121  . alum & mag hydroxide-simeth (MAALOX/MYLANTA) 200-200-20 MG/5ML suspension 30 mL  30 mL Oral PRN Noland Fordyce, PA-C      . LORazepam (ATIVAN) tablet 1 mg  1 mg Oral Q8H PRN Noland Fordyce, PA-C      . nicotine (NICODERM CQ - dosed in mg/24 hours) patch 21 mg  21 mg Transdermal Daily Noland Fordyce, PA-C   21 mg at 12/21/13 2225  . ondansetron (ZOFRAN) tablet 4 mg  4 mg Oral Q8H PRN Noland Fordyce, PA-C      . zolpidem (AMBIEN) tablet 5 mg  5 mg Oral QHS PRN Noland Fordyce, PA-C       Current Outpatient Prescriptions  Medication Sig Dispense Refill  . ibuprofen (ADVIL,MOTRIN) 200 MG tablet Take 400 mg by mouth every 6 (six) hours as needed  for moderate pain.        Psychiatric Specialty Exam:     Blood pressure 129/76, pulse 83, temperature 98.3 F (36.8 C), temperature source Oral, resp. rate 16, SpO2 96.00%.There is no height or weight on file to calculate BMI.  General Appearance: Casual  Eye Contact::  Fair  Speech:  Slow  Volume:  Decreased  Mood:  Euthymic  Affect:  Congruent  Thought Process:  Coherent and Linear  Orientation:  Full (Time, Place, and Person)  Thought Content:  Rumination  Suicidal Thoughts:  No  Homicidal Thoughts:  No  Memory:  Recent;   Fair  Judgement:  Poor  Insight:  Shallow  Psychomotor Activity:  Decreased  Concentration:  Fair  Recall:  [place]: Fair  Akathisia:  Negative  Handed:  Right  AIMS (if indicated):     Assets:  Desire for  Improvement Social Support  Sleep:      Musculoskeletal: Strength & Muscle Tone: within normal limits Gait & Station: normal Patient leans: Front  Treatment Plan Summary: Discharge Home. Follow up with outpatient services. Recommend Drug Rehab program. Can follow up with [facility].   De Nurse, Mussa Groesbeck MD 12/22/2013 2:02 PM

## 2013-12-22 NOTE — Discharge Instructions (Signed)
Major Depressive Disorder °Major depressive disorder (MDD) is a mental illness. It also may be called clinical depression or unipolar depression. MDD usually causes feelings of sadness, hopelessness, or helplessness. Some people with MDD do not feel particularly sad but lose interest in doing things they used to enjoy (anhedonia). MDD also can cause physical symptoms. It can interfere with work, school, relationships, and other normal everyday activities. MDD varies in severity but is longer lasting and more serious than the sadness we all feel from time to time in our lives. °MDD often is triggered by stressful life events or major life changes. Examples of these triggers include divorce, loss of your job or home, a move, and the death of a family member or close friend. Sometimes MDD occurs for no obvious reason at all. People who have family members with MDD or bipolar disorder are at higher risk for developing MDD, with or without life stressors. MDD can occur at any age. It may occur just once in your life (single episode MDD). It may occur multiple times (recurrent MDD). °SYMPTOMS °People with MDD have either anhedonia or depressed mood on nearly a daily basis for at least 2 weeks or longer. Symptoms of depressed mood include: °· Feelings of sadness (blue or down in the dumps) or emptiness. °· Feelings of hopelessness or helplessness. °· Tearfulness or episodes of crying (may be observed by others). °· Irritability (children and adolescents). °In addition to depressed mood or anhedonia or both, people with MDD have at least four of the following symptoms: °· Difficulty sleeping or sleeping too much.   °· Significant change (increase or decrease) in appetite or weight.   °· Lack of energy or motivation. °· Feelings of guilt and worthlessness.   °· Difficulty concentrating, remembering, or making decisions. °· Unusually slow movement (psychomotor retardation) or restlessness (as observed by others).    °· Recurrent wishes for death, recurrent thoughts of self-harm (suicide), or a suicide attempt. °People with MDD commonly have persistent negative thoughts about themselves, other people, and the world. People with severe MDD may experience distorted beliefs or perceptions about the world (psychotic delusions). They also may see or hear things that are not real (psychotic hallucinations). °DIAGNOSIS °MDD is diagnosed through an assessment by your caregiver. Your caregiver will ask about aspects of your daily life, such as mood, sleep, and appetite, to see if you have the diagnostic symptoms of MDD. Your caregiver may ask about your medical history and use of alcohol or drugs, including prescription medications. Your caregiver also may do a physical exam and blood work. This is because certain medical conditions and the use of certain substances can cause MDD-like symptoms (secondary depression). Your caregiver also may refer you to a mental health specialist for further evaluation and treatment. °TREATMENT °It is important to recognize the symptoms of MDD and seek treatment. The following treatments can be prescribed for MDD:   °· Medication Antidepressant medications usually are prescribed. Antidepressant medications are thought to correct chemical imbalances in the brain that are commonly associated with MDD. Other types of medication may be added if MDD symptoms do not respond to antidepressant medications alone or if psychotic delusions or hallucinations occur. °· Talk therapy Talk therapy can be helpful in treating MDD by providing support, education, and guidance. Certain types of talk therapy also can help with negative thinking (cognitive behavioral therapy) and with relationship issues that trigger MDD (interpersonal therapy). °A mental health specialist can help determine which treatment is best for you. Most people with MDD do well with a   combination of medication and talk therapy. Treatments involving  electrical stimulation of the brain can be used in situations with extremely severe symptoms or when medication and talk therapy do not work over time. These treatments include electroconvulsive therapy, transcranial magnetic stimulation, and vagal nerve stimulation. °Document Released: 01/16/2013 Document Reviewed: 01/16/2013 °[employer]® Patient Information ©2014 [employer]. ° °

## 2013-12-27 ENCOUNTER — Encounter (HOSPITAL_COMMUNITY): Payer: Self-pay | Admitting: Emergency Medicine

## 2013-12-27 ENCOUNTER — Emergency Department (HOSPITAL_COMMUNITY)
Admission: EM | Admit: 2013-12-27 | Discharge: 2013-12-28 | Disposition: A | Discharge status: Other Acute Inpatient Hospital | Payer: Self-pay | Attending: Emergency Medicine | Admitting: Emergency Medicine

## 2013-12-27 DIAGNOSIS — F32A Depression, unspecified: Secondary | ICD-10-CM

## 2013-12-27 DIAGNOSIS — R5381 Other malaise: Secondary | ICD-10-CM | POA: Insufficient documentation

## 2013-12-27 DIAGNOSIS — F17200 Nicotine dependence, unspecified, uncomplicated: Secondary | ICD-10-CM | POA: Insufficient documentation

## 2013-12-27 DIAGNOSIS — R5383 Other fatigue: Secondary | ICD-10-CM

## 2013-12-27 DIAGNOSIS — R45851 Suicidal ideations: Secondary | ICD-10-CM | POA: Insufficient documentation

## 2013-12-27 DIAGNOSIS — Z5900 Homelessness unspecified: Secondary | ICD-10-CM | POA: Insufficient documentation

## 2013-12-27 DIAGNOSIS — G4700 Insomnia, unspecified: Secondary | ICD-10-CM | POA: Insufficient documentation

## 2013-12-27 DIAGNOSIS — F329 Major depressive disorder, single episode, unspecified: Secondary | ICD-10-CM | POA: Insufficient documentation

## 2013-12-27 DIAGNOSIS — F3289 Other specified depressive episodes: Secondary | ICD-10-CM | POA: Insufficient documentation

## 2013-12-27 DIAGNOSIS — F411 Generalized anxiety disorder: Secondary | ICD-10-CM | POA: Insufficient documentation

## 2013-12-27 HISTORY — DX: Major depressive disorder, single episode, unspecified: F32.9

## 2013-12-27 HISTORY — DX: Depression, unspecified: F32.A

## 2013-12-27 HISTORY — DX: Anxiety disorder, unspecified: F41.9

## 2013-12-27 LAB — COMPREHENSIVE METABOLIC PANEL
ALK PHOS: 88 U/L (ref 39–117)
ALT: 75 U/L — ABNORMAL HIGH (ref 0–35)
AST: 93 U/L — ABNORMAL HIGH (ref 0–37)
Albumin: 3.8 g/dL (ref 3.5–5.2)
BUN: 4 mg/dL — AB (ref 6–23)
CHLORIDE: 101 meq/L (ref 96–112)
CO2: 21 meq/L (ref 19–32)
Calcium: 9.6 mg/dL (ref 8.4–10.5)
Creatinine, Ser: 0.44 mg/dL — ABNORMAL LOW (ref 0.50–1.10)
GFR calc Af Amer: >90 mL/min (ref >90)
GLUCOSE: 106 mg/dL — AB (ref 70–99)
POTASSIUM: 3.9 meq/L (ref 3.7–5.3)
Sodium: 140 mEq/L (ref 137–147)
Total Protein: 6.6 g/dL (ref 6.0–8.3)

## 2013-12-27 LAB — RAPID URINE DRUG SCREEN, HOSP PERFORMED
Amphetamines: NOT DETECTED
BARBITURATES: NOT DETECTED
BENZODIAZEPINES: NOT DETECTED
Cocaine: NOT DETECTED
Opiates: NOT DETECTED
Tetrahydrocannabinol: NOT DETECTED

## 2013-12-27 LAB — CBC
HEMATOCRIT: 40.3 % (ref 36.0–46.0)
HEMOGLOBIN: 13.5 g/dL (ref 12.0–15.0)
MCH: 32.1 pg (ref 26.0–34.0)
MCHC: 33.5 g/dL (ref 30.0–36.0)
MCV: 95.7 fL (ref 78.0–100.0)
Platelets: 366 10*3/uL (ref 150–400)
RBC: 4.21 MIL/uL (ref 3.87–5.11)
RDW: 15 % (ref 11.5–15.5)
WBC: 7.1 10*3/uL (ref 4.0–10.5)

## 2013-12-27 LAB — ETHANOL: ALCOHOL ETHYL (B): 76 mg/dL — AB (ref 0–11)

## 2013-12-27 LAB — ACETAMINOPHEN LEVEL

## 2013-12-27 LAB — SALICYLATE LEVEL: Salicylate Lvl: <2 mg/dL — ABNORMAL LOW (ref 2.8–20.0)

## 2013-12-27 MED ORDER — ALUM & MAG HYDROXIDE-SIMETH 200-200-20 MG/5ML PO SUSP: 30.0000 mL | ORAL | Status: DC | PRN

## 2013-12-27 MED ORDER — THIAMINE HCL 100 MG/ML IJ SOLN
100.0000 mg | Freq: Every day | INTRAMUSCULAR | Status: DC
Start: 2013-12-27 — End: 2013-12-27

## 2013-12-27 MED ORDER — LORAZEPAM 1 MG PO TABS: 0.0000 mg | ORAL_TABLET | Freq: Two times a day (BID) | ORAL | Status: DC

## 2013-12-27 MED ORDER — ONDANSETRON HCL 4 MG PO TABS: 4.0000 mg | ORAL_TABLET | Freq: Three times a day (TID) | ORAL | Status: DC | PRN

## 2013-12-27 MED ORDER — VITAMIN B-1 100 MG PO TABS
100.0000 mg | ORAL_TABLET | Freq: Every day | ORAL | Status: DC
  Administered 2013-12-27: 100 mg via ORAL
  Filled 2013-12-27: qty 1

## 2013-12-27 MED ORDER — NICOTINE 21 MG/24HR TD PT24
21.0000 mg | MEDICATED_PATCH | Freq: Every day | TRANSDERMAL | Status: DC
  Administered 2013-12-27: 21 mg via TRANSDERMAL
  Filled 2013-12-27: qty 1

## 2013-12-27 MED ORDER — ADULT MULTIVITAMIN W/MINERALS CH
1.0000 | ORAL_TABLET | Freq: Every day | ORAL | Status: DC
  Administered 2013-12-27: 1 via ORAL
  Filled 2013-12-27: qty 1

## 2013-12-27 MED ORDER — FOLIC ACID 1 MG PO TABS
1.0000 mg | ORAL_TABLET | Freq: Every day | ORAL | Status: DC
  Administered 2013-12-27: 1 mg via ORAL
  Filled 2013-12-27: qty 1

## 2013-12-27 MED ORDER — LORAZEPAM 1 MG PO TABS: 0.0000 mg | ORAL_TABLET | Freq: Four times a day (QID) | ORAL | Status: DC

## 2013-12-27 MED ORDER — LORAZEPAM 1 MG PO TABS: 1.0000 mg | ORAL_TABLET | Freq: Three times a day (TID) | ORAL | Status: DC | PRN

## 2013-12-27 MED ORDER — ZOLPIDEM TARTRATE 5 MG PO TABS
5.0000 mg | ORAL_TABLET | Freq: Every evening | ORAL | Status: DC | PRN
  Administered 2013-12-27: 5 mg via ORAL
  Filled 2013-12-27: qty 1

## 2013-12-27 MED ORDER — ACETAMINOPHEN 325 MG PO TABS: 650.0000 mg | ORAL_TABLET | ORAL | Status: DC | PRN

## 2013-12-27 NOTE — ED Notes (Signed)
All personal items were placed in holding area, and all personal items were locked in security

## 2013-12-27 NOTE — ED Provider Notes (Signed)
CSN: [id number]     Arrival date & time 12/27/13  1700 History   First MD Initiated Contact with Patient 12/27/13 1822     Chief Complaint  Patient presents with  . Medical Clearance  . Suicidal     (Consider location/radiation/quality/duration/timing/severity/associated sxs/prior Treatment) Patient is a 63 y.o. female presenting with mental health disorder. The history is provided by the patient. No language interpreter was used.  Mental Health Problem Presenting symptoms: depression and suicidal thoughts   Presenting symptoms: no agitation   Patient accompanied by:  Law enforcement Degree of incapacity (severity):  Severe Onset quality:  Unable to specify Timing:  Constant Progression:  Worsening Chronicity:  Recurrent Context: alcohol use and stressful life event   Treatment compliance:  Untreated Relieved by:  Nothing Worsened by:  Nothing tried Ineffective treatments:  None tried Associated symptoms: anxiety, fatigue, feelings of worthlessness and insomnia   Associated symptoms: no abdominal pain, no appetite change, no chest pain, no decreased need for sleep and no headaches   Risk factors: hx of mental illness   Risk factors: no hx of suicide attempts     Past Medical History  Diagnosis Date  . Depression   . Anxiety    History reviewed. No pertinent past surgical history. History reviewed. No pertinent family history. History  Substance Use Topics  . Smoking status: Current Every Day Smoker -- 1.50 packs/day    Types: Cigarettes  . Smokeless tobacco: Not on file  . Alcohol Use: Yes     Comment: 12 Beers, Daily    OB History   Grav Para Term Preterm Abortions TAB SAB Ect Mult Living                 Review of Systems  Constitutional: Positive for fatigue. Negative for fever, chills, diaphoresis, activity change and appetite change.  HENT: Negative for congestion, facial swelling, rhinorrhea and sore throat.   Eyes: Negative for photophobia and discharge.   Respiratory: Negative for cough, chest tightness and shortness of breath.   Cardiovascular: Negative for chest pain, palpitations and leg swelling.  Gastrointestinal: Negative for nausea, vomiting, abdominal pain and diarrhea.  Endocrine: Negative for polydipsia and polyuria.  Genitourinary: Negative for dysuria, frequency, difficulty urinating and pelvic pain.  Musculoskeletal: Negative for arthralgias, back pain, neck pain and neck stiffness.  Skin: Negative for color change and wound.  Allergic/Immunologic: Negative for immunocompromised state.  Neurological: Negative for facial asymmetry, weakness, numbness and headaches.  Hematological: Does not bruise/bleed easily.  Psychiatric/Behavioral: Positive for suicidal ideas. Negative for confusion and agitation. The patient is nervous/anxious and has insomnia.       Allergies  Asa  Home Medications   No current outpatient prescriptions on file. BP 126/76  Pulse 64  Temp(Src) 98 F (36.7 C) (Oral)  Resp 18  SpO2 99% Physical Exam  Constitutional: She is oriented to person, place, and time. She appears well-developed and well-nourished. No distress.  HENT:  Head: Normocephalic and atraumatic.  Mouth/Throat: No oropharyngeal exudate.  Eyes: Pupils are equal, round, and reactive to light.  Neck: Normal range of motion. Neck supple.  Cardiovascular: Normal rate, regular rhythm and normal heart sounds.  Exam reveals no gallop and no friction rub.   No murmur heard. Pulmonary/Chest: Effort normal and breath sounds normal. No respiratory distress. She has no wheezes. She has no rales.  Abdominal: Soft. Bowel sounds are normal. She exhibits no distension and no mass. There is no tenderness. There is no rebound and no guarding.  Musculoskeletal: Normal range of motion. She exhibits no edema and no tenderness.  Neurological: She is alert and oriented to person, place, and time. GCS eye subscore is 4. GCS verbal subscore is 5. GCS motor  subscore is 6.  Skin: Skin is warm and dry.  Psychiatric: She has a normal mood and affect. Her speech is delayed. She is slowed. She expresses suicidal ideation.    ED Course  Procedures (including critical care time) Labs Review Labs Reviewed  COMPREHENSIVE METABOLIC PANEL - Abnormal; Notable for the following:    Glucose, Bld 106 (*)    BUN 4 (*)    Creatinine, Ser 0.44 (*)    AST 93 (*)    ALT 75 (*)    Total Bilirubin <0.2 (*)    All other components within normal limits  ETHANOL - Abnormal; Notable for the following:    Alcohol, Ethyl (B) 76 (*)    All other components within normal limits  SALICYLATE LEVEL - Abnormal; Notable for the following:    Salicylate Lvl <2.0 (*)    All other components within normal limits  ACETAMINOPHEN LEVEL  CBC  URINE RAPID DRUG SCREEN (HOSP PERFORMED)   Imaging Review No results found.   EKG Interpretation None      MDM   Final diagnoses:  Depression  Suicidal ideation    Pt is a 63 y.o. female with Pmhx as above who presents with worsening depression and SI with plan to OD on heroin. She expresses anxiety, depression, hopelessness. She is homeless, unemployed and has hx of chronic ETOH abuse. No prior suicide attempts, [facility], but no prior admits.  On PE, VSS, pt in NAD. Cardiopulm & abdominal exam benign. ETOH 76. No hx of ETOH w/draw symptoms, seizures or DTs.  TTS to consult.  IVC done by pt's brother, first exam done by myself and I agree pt would benefit from inpt psych admission.         Shanna CiscoMegan E Ladarious Kresse, MD 12/28/13 [phone]

## 2013-12-27 NOTE — ED Notes (Signed)
DINNER TRAY ORDERED FOR PT °

## 2013-12-27 NOTE — BH Assessment (Signed)
Tele Assessment Note   Danielle Villanueva is a 63 y.o. female who presents to [facility] via IVC petition with SI/depression/SA.  Pt denies HI/AVH.  Pt is SI w/plan to overdose on heroin--"the depression is just so overwhelming".  Pt reports stressors attributing to her SI current mental state:(1) Job loss in 11/2013;(2) Homeless;(3) No transportation; and (4) No support.  Pt told family members that she wished she was dead, stating that everyone has abandoned her--pastor, son and others and there is no reason to live.  Pt says she's been depressed and SI since 11/2013.  Pt admits she consumes excessive amounts of alcohol, daily.  She drinks 12 beers everyday, last drink was today.  She drank 4 beers.  Pt is despondent, with flat affect and decreased focus/concentration.  This Clinical research associate asked pt several times if she continued to endorse SI w/plan and she avoided answering the question and talked about other things that upset her.  Pt states she has no support from family or friends.  Pt appears agitated and restless, she says that drinks to help her sleep and calm her down but nothing is working.        Axis I: Major depressive disorder, Recurrent episode, Severe;  Alcohol-induced anxiety disorder Axis II: Deferred Axis III:  Past Medical History  Diagnosis Date  . Depression   . Anxiety    Axis IV: economic problems, housing problems, occupational problems, other psychosocial or environmental problems, problems related to social environment and problems with primary support group Axis V: 31-40 impairment in reality testing  Past Medical History:  Past Medical History  Diagnosis Date  . Depression   . Anxiety     History reviewed. No pertinent past surgical history.  Family History: History reviewed. No pertinent family history.  Social History:  reports that she has been smoking Cigarettes.  She has been smoking about 1.50 packs per day. She does not have any smokeless tobacco history on file. She  reports that she drinks alcohol. She reports that she does not use illicit drugs.  Additional Social History:  Alcohol / Drug Use Pain Medications: None  Prescriptions: None  Over the Counter: None  History of alcohol / drug use?: Yes Longest period of sobriety (when/how long): None  Negative Consequences of Use: Work / School;Personal relationships;Financial Withdrawal Symptoms: Other (Comment) (No current w/d sxs ) Substance #1 Name of Substance 1: Alcohol  1 - Age of First Use: 20's  1 - Amount (size/oz): 12+ Beers  1 - Frequency: Daily  1 - Duration: On-going  1 - Last Use / Amount: 12/27/13  CIWA: CIWA-Ar BP: 126/73 mmHg Pulse Rate: 73 Nausea and Vomiting: no nausea and no vomiting (pt has eaten 100% of her meal) Tactile Disturbances: none Tremor: no tremor Auditory Disturbances: not present Paroxysmal Sweats: no sweat visible Visual Disturbances: not present Anxiety: no anxiety, at ease Headache, Fullness in Head: none present Agitation: somewhat more than normal activity Orientation and Clouding of Sensorium: oriented and can do serial additions CIWA-Ar Total: 1 COWS:    Allergies:  Allergies  Allergen Reactions  . Asa [Aspirin] Nausea And Vomiting    Home Medications:  (Not in a hospital admission)  OB/GYN Status:  No LMP recorded. Patient is postmenopausal.  General Assessment Data Location of Assessment: [facility] ED Is this a Tele or Face-to-Face Assessment?: Tele Assessment Is this an Initial Assessment or a Re-assessment for this encounter?: Initial Assessment Living Arrangements: Other (Comment) (Homeless ) Can pt return to current living  arrangement?: Yes Admission Status: Involuntary Is patient capable of signing voluntary admission?: No Transfer from: Acute Hospital Referral Source: MD  Medical Screening Exam [facility] Walk-in ONLY) Medical Exam completed: No Reason for MSE not completed: Other: (None )  [facility] Crisis Care Plan Living Arrangements: Other  (Comment) (Homeless ) Name of Psychiatrist: None  Name of Therapist: None   Education Status Is patient currently in school?: No Current Grade: None  Highest grade of school patient has completed: None  Name of school: None  Contact person: None   Risk to self Suicidal Ideation: Yes-Currently Present Suicidal Intent: Yes-Currently Present Is patient at risk for suicide?: Yes Suicidal Plan?: Yes-Currently Present Specify Current Suicidal Plan: Overdose on heroin  Access to Means: Yes Specify Access to Suicidal Means: "My daughter is addicted to it'.  What has been your use of drugs/alcohol within the last 12 months?: Abusing: alcohol  Previous Attempts/Gestures: No How many times?: 0 Other Self Harm Risks: None  Triggers for Past Attempts: Family contact;Other (Comment) (Financial issues ) Intentional Self Injurious Behavior: None Family Suicide History: No Recent stressful life event(s): Job Loss;Financial Problems;Conflict (Comment);Other (Comment) (Family problems; homeless ) Persecutory voices/beliefs?: No Depression: Yes Depression Symptoms: Tearfulness;Isolating;Loss of interest in usual pleasures;Feeling worthless/self pity;Despondent Substance abuse history and/or treatment for substance abuse?: Yes Suicide prevention information given to non-admitted patients: Not applicable  Risk to Others Homicidal Ideation: No Thoughts of Harm to Others: No Current Homicidal Intent: No Current Homicidal Plan: No Access to Homicidal Means: No Identified Victim: None  History of harm to others?: No Assessment of Violence: None Noted Violent Behavior Description: None  Does patient have access to weapons?: No Criminal Charges Pending?: No Does patient have a court date: No  Psychosis Hallucinations: None noted Delusions: None noted  Mental Status Report Appear/Hygiene: Disheveled;Poor hygiene Eye Contact: Good Motor Activity: Unremarkable;Restlessness Speech:  Logical/coherent;Slow;Soft Level of Consciousness: Alert Mood: Depressed;Anxious;Sad;Helpless Affect: Anxious;Depressed;Sad Anxiety Level: Moderate Thought Processes: Coherent;Relevant Judgement: Impaired Orientation: Person;Place;Time;Situation Obsessive Compulsive Thoughts/Behaviors: None  Cognitive Functioning Concentration: Normal Memory: Recent Intact;Remote Intact IQ: Average Insight: Poor Impulse Control: Fair Appetite: Poor Weight Loss:  (Unk ) Weight Gain: 0 Sleep: Decreased Total Hours of Sleep: 5 Vegetative Symptoms: None  ADLScreening [facility] Assessment Services) Patient's cognitive ability adequate to safely complete daily activities?: Yes Patient able to express need for assistance with ADLs?: No Independently performs ADLs?: Yes (appropriate for developmental age)  Prior Inpatient Therapy Prior Inpatient Therapy: No Prior Therapy Dates: None  Prior Therapy Facilty/Provider(s): None  Reason for Treatment: None   Prior Outpatient Therapy Prior Outpatient Therapy: No Prior Therapy Dates: None  Prior Therapy Facilty/Provider(s): None  Reason for Treatment: None   ADL Screening (condition at time of admission) Patient's cognitive ability adequate to safely complete daily activities?: Yes Is the patient deaf or have difficulty hearing?: No Does the patient have difficulty seeing, even when wearing glasses/contacts?: No Does the patient have difficulty concentrating, remembering, or making decisions?: Yes Patient able to express need for assistance with ADLs?: No Does the patient have difficulty dressing or bathing?: Yes Independently performs ADLs?: Yes (appropriate for developmental age) Does the patient have difficulty walking or climbing stairs?: No Weakness of Legs: None Weakness of Arms/Hands: None  Home Assistive Devices/Equipment Home Assistive Devices/Equipment: None  Therapy Consults (therapy consults require a physician order) PT Evaluation  Needed: No OT Evalulation Needed: No SLP Evaluation Needed: No Abuse/Neglect Assessment (Assessment to be complete while patient is alone) Physical Abuse: Denies Verbal Abuse: Denies Sexual Abuse: Denies  Exploitation of patient/patient's resources: Denies Self-Neglect: Denies Values / Beliefs Cultural Requests During Hospitalization: None Spiritual Requests During Hospitalization: None Consults Spiritual Care Consult Needed: No Social Work Consult Needed: No Merchant navy officerAdvance Directives (For Healthcare) Advance Directive: Patient does not have advance directive;Patient would not like information Pre-existing out of facility DNR order (yellow form or pink MOST form): No Nutrition Screen- [facility] Adult/[facility]/AP Patient's home diet: Regular  Additional Information 1:1 In Past 12 Months?: No CIRT Risk: No Elopement Risk: No Does patient have medical clearance?: Yes     Disposition:  Disposition Initial Assessment Completed for this Encounter: Yes Disposition of Patient: Inpatient treatment program;[facility] to [facility] ) Type of inpatient treatment program: Adult Patient referred to: Other (Comment) [facility] for possible inprt admission )  Murrell ReddenSimmons, Yeraldy Spike C 12/27/2013 10:33 PM

## 2013-12-27 NOTE — Progress Notes (Signed)
CSW received call from pt son, who states that since patient has been discharged she has called telling family that she is going to od on heroine a few nights ago.  CSW encouraged patient son to call 911 if patietn was saying threatening statement of self harm or harm to others, and also explained the IVC process. Pt son plans to follow up with [employer] and magistrate.   Byrd HesselbachKristen Luetta Piazza, LCSW [phone]  ED CSW 12/27/2013 924am

## 2013-12-27 NOTE — ED Notes (Signed)
Pt c/o recent depression and SI with plan to overdose on heroin; pt here with [employer] with IVC papers; pt admits to ETOH use this am; pt denies drug use

## 2013-12-28 ENCOUNTER — Encounter (HOSPITAL_COMMUNITY): Payer: Self-pay

## 2013-12-28 ENCOUNTER — Inpatient Hospital Stay (HOSPITAL_COMMUNITY)
Admission: EM | Admit: 2013-12-28 | Discharge: 2014-01-02 | DRG: 897 | Disposition: A | Discharge status: Rehabilitation Facility | Payer: Federal, State, Local not specified - Other | Source: Intra-hospital | Attending: Psychiatry | Admitting: Psychiatry

## 2013-12-28 DIAGNOSIS — Z5900 Homelessness unspecified: Secondary | ICD-10-CM

## 2013-12-28 DIAGNOSIS — F329 Major depressive disorder, single episode, unspecified: Secondary | ICD-10-CM | POA: Diagnosis present

## 2013-12-28 DIAGNOSIS — G4700 Insomnia, unspecified: Secondary | ICD-10-CM | POA: Diagnosis present

## 2013-12-28 DIAGNOSIS — Z5987 Material hardship due to limited financial resources, not elsewhere classified: Secondary | ICD-10-CM

## 2013-12-28 DIAGNOSIS — F1094 Alcohol use, unspecified with alcohol-induced mood disorder: Secondary | ICD-10-CM | POA: Diagnosis present

## 2013-12-28 DIAGNOSIS — F1020 Alcohol dependence, uncomplicated: Principal | ICD-10-CM | POA: Diagnosis present

## 2013-12-28 DIAGNOSIS — F1994 Other psychoactive substance use, unspecified with psychoactive substance-induced mood disorder: Secondary | ICD-10-CM

## 2013-12-28 DIAGNOSIS — F1010 Alcohol abuse, uncomplicated: Secondary | ICD-10-CM | POA: Diagnosis present

## 2013-12-28 DIAGNOSIS — F411 Generalized anxiety disorder: Secondary | ICD-10-CM | POA: Diagnosis present

## 2013-12-28 DIAGNOSIS — R45851 Suicidal ideations: Secondary | ICD-10-CM

## 2013-12-28 DIAGNOSIS — Z598 Other problems related to housing and economic circumstances: Secondary | ICD-10-CM

## 2013-12-28 DIAGNOSIS — F17200 Nicotine dependence, unspecified, uncomplicated: Secondary | ICD-10-CM | POA: Diagnosis present

## 2013-12-28 MED ORDER — METHOCARBAMOL 100 MG/ML IJ SOLN: 500.0000 mg | Freq: Three times a day (TID) | INTRAMUSCULAR | Status: DC

## 2013-12-28 MED ORDER — THIAMINE HCL 100 MG/ML IJ SOLN: 100.0000 mg | Freq: Once | INTRAMUSCULAR | Status: DC

## 2013-12-28 MED ORDER — METHOCARBAMOL 500 MG PO TABS
500.0000 mg | ORAL_TABLET | Freq: Three times a day (TID) | ORAL | Status: DC | PRN
  Administered 2013-12-30: 500 mg via ORAL
  Filled 2013-12-28: qty 1

## 2013-12-28 MED ORDER — CHLORDIAZEPOXIDE HCL 25 MG PO CAPS
25.0000 mg | ORAL_CAPSULE | Freq: Three times a day (TID) | ORAL | Status: DC
Start: 2013-12-29 — End: 2013-12-29
  Administered 2013-12-29 (×2): 25 mg via ORAL
  Filled 2013-12-28 (×2): qty 1

## 2013-12-28 MED ORDER — CHLORDIAZEPOXIDE HCL 25 MG PO CAPS: 25.0000 mg | ORAL_CAPSULE | Freq: Every day | ORAL | Status: DC

## 2013-12-28 MED ORDER — ACETAMINOPHEN 500 MG PO TABS
500.0000 mg | ORAL_TABLET | Freq: Four times a day (QID) | ORAL | Status: DC | PRN
  Administered 2013-12-28: 500 mg via ORAL
  Filled 2013-12-28: qty 1

## 2013-12-28 MED ORDER — CHLORDIAZEPOXIDE HCL 25 MG PO CAPS: 25.0000 mg | ORAL_CAPSULE | ORAL | Status: DC

## 2013-12-28 MED ORDER — CHLORDIAZEPOXIDE HCL 25 MG PO CAPS: 25.0000 mg | ORAL_CAPSULE | Freq: Four times a day (QID) | ORAL | Status: DC | PRN

## 2013-12-28 MED ORDER — VITAMIN B-1 100 MG PO TABS
100.0000 mg | ORAL_TABLET | Freq: Every day | ORAL | Status: DC
  Administered 2013-12-29 – 2014-01-01 (×4): 100 mg via ORAL
  Filled 2013-12-28 (×6): qty 1

## 2013-12-28 MED ORDER — ADULT MULTIVITAMIN W/MINERALS CH
1.0000 | ORAL_TABLET | Freq: Every day | ORAL | Status: DC
  Administered 2013-12-28 – 2014-01-01 (×5): 1 via ORAL
  Filled 2013-12-28 (×8): qty 1

## 2013-12-28 MED ORDER — CHLORDIAZEPOXIDE HCL 25 MG PO CAPS
25.0000 mg | ORAL_CAPSULE | Freq: Four times a day (QID) | ORAL | Status: AC
  Administered 2013-12-28 (×4): 25 mg via ORAL
  Filled 2013-12-28 (×4): qty 1

## 2013-12-28 MED ORDER — ONDANSETRON 4 MG PO TBDP
4.0000 mg | ORAL_TABLET | Freq: Four times a day (QID) | ORAL | Status: AC | PRN
Start: 2013-12-28 — End: 2013-12-31

## 2013-12-28 MED ORDER — ALUM & MAG HYDROXIDE-SIMETH 200-200-20 MG/5ML PO SUSP
30.0000 mL | ORAL | Status: DC | PRN
Start: 2013-12-28 — End: 2014-01-02

## 2013-12-28 MED ORDER — LOPERAMIDE HCL 2 MG PO CAPS: 2.0000 mg | ORAL_CAPSULE | ORAL | Status: AC | PRN

## 2013-12-28 MED ORDER — NICOTINE 21 MG/24HR TD PT24
21.0000 mg | MEDICATED_PATCH | Freq: Every day | TRANSDERMAL | Status: DC
  Administered 2013-12-28 – 2014-01-01 (×6): 21 mg via TRANSDERMAL
  Filled 2013-12-28 (×7): qty 1

## 2013-12-28 MED ORDER — MAGNESIUM HYDROXIDE 400 MG/5ML PO SUSP
30.0000 mL | Freq: Every day | ORAL | Status: DC | PRN
Start: 2013-12-28 — End: 2014-01-02

## 2013-12-28 MED ORDER — LIDOCAINE 5 % EX PTCH
1.0000 | MEDICATED_PATCH | CUTANEOUS | Status: DC
  Administered 2013-12-28 – 2013-12-29 (×2): 1 via TRANSDERMAL
  Filled 2013-12-28 (×3): qty 1

## 2013-12-28 MED ORDER — MIRTAZAPINE 15 MG PO TABS
15.0000 mg | ORAL_TABLET | Freq: Every day | ORAL | Status: DC
  Administered 2013-12-28 – 2014-01-01 (×5): 15 mg via ORAL
  Filled 2013-12-28 (×7): qty 1

## 2013-12-28 MED ORDER — HYDROXYZINE HCL 25 MG PO TABS: 25.0000 mg | ORAL_TABLET | Freq: Four times a day (QID) | ORAL | Status: AC | PRN

## 2013-12-28 MED ORDER — TRAZODONE HCL 50 MG PO TABS
50.0000 mg | ORAL_TABLET | Freq: Every evening | ORAL | Status: DC | PRN
  Administered 2013-12-28 – 2013-12-29 (×2): 50 mg via ORAL
  Filled 2013-12-28 (×6): qty 1

## 2013-12-28 NOTE — ED Notes (Signed)
[facility] that patient may be transfer to that facility, acceting dr, is dr Dub Mikeslugo

## 2013-12-28 NOTE — Progress Notes (Signed)
Recreation Therapy Notes  Animal-Assisted Activity/Therapy (AAA/T) Program Checklist/Progress Notes Patient Eligibility Criteria Checklist & Daily Group note for Rec Tx Intervention  Date: 03.26.2015 Time: 2:45pm [address]    AAA/T Program Assumption of Risk Form signed by Patient/ or Parent Legal Guardian yes  Patient is free of allergies or sever asthma yes  Patient reports no fear of animals yes  Patient reports no history of cruelty to animals yes   Patient understands his/her participation is voluntary yes  Behavioral Response: DID NOT ATTEND.   Marykay Lexenise L Phylis Javed, LRT/CTRS  Jearl KlinefelterBlanchfield, Hurshel Bouillon L 12/28/2013 4:38 PM

## 2013-12-28 NOTE — H&P (Signed)
Psychiatric Admission Assessment Adult  Patient Identification:  Danielle Villanueva  Date of Evaluation:  12/28/2013  Chief Complaint:  Alcohol Induced anxiety d/o MDD  History of Present Illness: Danielle Villanueva is 63 years old, Caucasian female. She reports, "My cousin took me to the hospital because of my bad depression, anxiety and alcoholism. I drink because I have no job, no home and no car. I lived in [place] from December thru March 1st of this (year)  2015. I drink to cope. Alcohol is my bandaid to my problems. I feel broken. I'm ashamed of me. I have been drinking heavily x 10 weeks. I drink about 12 cans of beer daily. I have been an alcoholic x 10 years. I have no support system. I feel very depressed and suicidal". Adylene denies any intent and or plans to hurt self and or others.  Elements:  Location:  alcoholism, increased depression, anxiety. Quality:  Hopelessness, suicidal ideations, crying spells, increased alcohol consumption. Severity:  Severe. Timing:  Drinking worsenned in the last 10 weeks. Duration:  Chronic, been drinking x 10 years. Context:  "I feel broken, I'm ashamed of myself, I drink to cope".  Associated Signs/Synptoms:  Depression Symptoms:  depressed mood, insomnia, feelings of worthlessness/guilt, hopelessness, suicidal thoughts without plan,  (Hypo) Manic Symptoms:  Impulsivity,  Anxiety Symptoms:  Excessive Worry,  Psychotic Symptoms:  Hallucinations: Denies  PTSD Symptoms: Had a traumatic exposure:  Denies  Total Time spent with patient: 45 minutes  Psychiatric Specialty Exam: Physical Exam  Constitutional: She is oriented to person, place, and time. She appears well-developed.  HENT:  Head: Normocephalic.  Eyes: Pupils are equal, round, and reactive to light.  Neck: Normal range of motion.  Cardiovascular: Normal rate.   Respiratory: Effort normal.  GI: Soft.  Genitourinary:  Denies  Musculoskeletal: Normal range of motion.  Neurological: She is  alert and oriented to person, place, and time.  Skin: Skin is warm and dry.  Psychiatric: Her speech is normal and behavior is normal. Judgment and thought content normal. Her mood appears anxious. Her affect is not angry, not blunt, not labile and not inappropriate. Cognition and memory are normal. She exhibits a depressed mood.    Review of Systems  Constitutional: Positive for chills, weight loss and malaise/fatigue.  HENT: Negative.   Eyes: Negative.   Respiratory: Negative.   Cardiovascular: Negative.   Gastrointestinal: Positive for abdominal pain.  Genitourinary: Negative.   Musculoskeletal: Negative.   Skin: Negative.   Neurological: Positive for tremors and weakness.  Endo/Heme/Allergies: Negative.   Psychiatric/Behavioral: Positive for depression, suicidal ideas (Denies plans and or intent) and substance abuse (Alcoholism). Negative for hallucinations. The patient is nervous/anxious and has insomnia.     Blood pressure 119/83, pulse 83, temperature 98 F (36.7 C), temperature source Oral, resp. rate 16, height $RemoveBe'5\' 1"'oPdMGNaxf$  (1.549 m), weight 45.813 kg (101 lb).Body mass index is 19.09 kg/(m^2).  General Appearance: Casual, thin frame  Eye Contact::  Fair  Speech:  Clear and Coherent  Volume:  Normal  Mood:  Angry, Anxious and Hopeless  Affect:  Flat and Tearful  Thought Process:  Coherent and Goal Directed  Orientation:  Full (Time, Place, and Person)  Thought Content:  Rumination and denies any hallucinations  Suicidal Thoughts:  No  Homicidal Thoughts:  No  Memory:  Immediate;   Good Recent;   Good Remote;   Good  Judgement:  Fair  Insight:  Fair  Psychomotor Activity:  Restlessness and Tremor  Concentration:  Fair  Recall:  Dudley Major of Knowledge:Fair  Language: Good  Akathisia:  No  Handed:  Right  AIMS (if indicated):     Assets:  Desire for Improvement  Sleep:  Number of Hours: 1.75    Musculoskeletal: Strength & Muscle Tone: within normal limits Gait &  Station: normal Patient leans: N/A  Past Psychiatric History: Diagnosis:  Alcohol Related Disorder - Severe (303.90), Substance induced mood disorder  Hospitalizations: [facility] adult unit  Outpatient Care: None reported  Substance Abuse Care: Would like to go to a RTC after discharge  Self-Mutilation: Denies  Suicidal Attempts: Denies attempts, admits thoughts  Violent Behaviors: Denies   Past Medical History:   Past Medical History  Diagnosis Date  . Depression   . Anxiety    None.  Allergies:   Allergies  Allergen Reactions  . Asa [Aspirin] Nausea And Vomiting   PTA Medications: Prescriptions prior to admission  Medication Sig Dispense Refill  . ibuprofen (ADVIL,MOTRIN) 200 MG tablet Take 400 mg by mouth every 6 (six) hours as needed for moderate pain.        Previous Psychotropic Medications:  Medication/Dose  See medication lists               Substance Abuse History in the last 12 months:  yes  Consequences of Substance Abuse: Medical Consequences:  Liver damage, Possible death by overdose Legal Consequences:  Arrests, jail time, Loss of driving privilege. Family Consequences:  Family discord, divorce and or separation.  Social History:  reports that she has been smoking Cigarettes.  She has been smoking about 1.50 packs per day. She does not have any smokeless tobacco history on file. She reports that she drinks alcohol. She reports that she does not use illicit drugs.  Additional Social History: Current Place of Residence: [place], [place]  Place of Birth: [place], [place]    Family Members: "I have 2 children"  Marital Status:  Single  Children: 2  Sons: 1  Daughters: 1  Relationships: Single  Education:  HS Financial planner Problems/Performance: Completed high school  Religious Beliefs/Practices: NA  History of Abuse (Emotional/Phsycial/Sexual): Denies  Occupational Experiences: English as a second language teacher History:  None.  Legal History:  Denies any pending legal charges  Hobbies/Interests: NA  Family History:  History reviewed. No pertinent family history.  Results for orders placed during the hospital encounter of 12/27/13 (from the past 72 hour(s))  ACETAMINOPHEN LEVEL     Status: None   Collection Time    12/27/13  5:15 PM      Result Value Ref Range   Acetaminophen (Tylenol), Serum <15.0  10 - 30 ug/mL   Comment:            THERAPEUTIC CONCENTRATIONS VARY     SIGNIFICANTLY. A RANGE OF 10-30     ug/mL MAY BE AN EFFECTIVE     CONCENTRATION FOR MANY PATIENTS.     HOWEVER, SOME ARE BEST TREATED     AT CONCENTRATIONS OUTSIDE THIS     RANGE.     ACETAMINOPHEN CONCENTRATIONS     >150 ug/mL AT 4 HOURS AFTER     INGESTION AND >50 ug/mL AT 12     HOURS AFTER INGESTION ARE     OFTEN ASSOCIATED WITH TOXIC     REACTIONS.  CBC     Status: None   Collection Time    12/27/13  5:15 PM      Result Value Ref Range   WBC 7.1  4.0 - 10.5  K/uL   RBC 4.21  3.87 - 5.11 MIL/uL   Hemoglobin 13.5  12.0 - 15.0 g/dL   HCT 61.2  24.0 - 01.8 %   MCV 95.7  78.0 - 100.0 fL   MCH 32.1  26.0 - 34.0 pg   MCHC 33.5  30.0 - 36.0 g/dL   RDW 09.7  04.4 - 92.5 %   Platelets 366  150 - 400 K/uL  COMPREHENSIVE METABOLIC PANEL     Status: Abnormal   Collection Time    12/27/13  5:15 PM      Result Value Ref Range   Sodium 140  137 - 147 mEq/L   Potassium 3.9  3.7 - 5.3 mEq/L   Chloride 101  96 - 112 mEq/L   CO2 21  19 - 32 mEq/L   Glucose, Bld 106 (*) 70 - 99 mg/dL   BUN 4 (*) 6 - 23 mg/dL   Creatinine, Ser 2.41 (*) 0.50 - 1.10 mg/dL   Calcium 9.6  8.4 - 59.0 mg/dL   Total Protein 6.6  6.0 - 8.3 g/dL   Albumin 3.8  3.5 - 5.2 g/dL   AST 93 (*) 0 - 37 U/L   ALT 75 (*) 0 - 35 U/L   Alkaline Phosphatase 88  39 - 117 U/L   Total Bilirubin <0.2 (*) 0.3 - 1.2 mg/dL   GFR calc non Af Amer >90  >90 mL/min   GFR calc Af Amer >90  >90 mL/min   Comment: (NOTE)     The eGFR has been calculated using the CKD EPI equation.     This calculation  has not been validated in all clinical situations.     eGFR's persistently <90 mL/min signify possible Chronic Kidney     Disease.  ETHANOL     Status: Abnormal   Collection Time    12/27/13  5:15 PM      Result Value Ref Range   Alcohol, Ethyl (B) 76 (*) 0 - 11 mg/dL   Comment:            LOWEST DETECTABLE LIMIT FOR     SERUM ALCOHOL IS 11 mg/dL     FOR MEDICAL PURPOSES ONLY  SALICYLATE LEVEL     Status: Abnormal   Collection Time    12/27/13  5:15 PM      Result Value Ref Range   Salicylate Lvl <2.0 (*) 2.8 - 20.0 mg/dL  URINE RAPID DRUG SCREEN (HOSP PERFORMED)     Status: None   Collection Time    12/27/13  5:37 PM      Result Value Ref Range   Opiates NONE DETECTED  NONE DETECTED   Cocaine NONE DETECTED  NONE DETECTED   Benzodiazepines NONE DETECTED  NONE DETECTED   Amphetamines NONE DETECTED  NONE DETECTED   Tetrahydrocannabinol NONE DETECTED  NONE DETECTED   Barbiturates NONE DETECTED  NONE DETECTED   Comment:            DRUG SCREEN FOR MEDICAL PURPOSES     ONLY.  IF CONFIRMATION IS NEEDED     FOR ANY PURPOSE, NOTIFY LAB     WITHIN 5 DAYS.                LOWEST DETECTABLE LIMITS     FOR URINE DRUG SCREEN     Drug Class       Cutoff (ng/mL)     Amphetamine      1000     Barbiturate  200     Benzodiazepine   098     Tricyclics       119     Opiates          300     Cocaine          300     THC              50   Psychological Evaluations:  Assessment:   DSM5: Schizophrenia Disorders:  NA Obsessive-Compulsive Disorders:  NA Trauma-Stressor Disorders:  NA Substance/Addictive Disorders:  Alcohol Related Disorder - Severe (303.90) Depressive Disorders:  Substance induced mood disorder  AXIS I:  Alcohol Related Disorder - Severe (303.90), Substance induced mood disorder AXIS II:  Deferred AXIS III:   Past Medical History  Diagnosis Date  . Depression   . Anxiety    AXIS IV:  other psychosocial or environmental problems and Alcoholism, chronic AXIS V:   1-10 persistent dangerousness to self and others present  Treatment Plan/Recommendations: 1. Admit for crisis management and stabilization, estimated length of stay 3-5 days.  2. Medication management to reduce current symptoms to base line and improve the patient's overall level of functioning; Librium detox for alcohol withdrawal, Remeron 15 mg Q hs for sleep, Citalopram 10 mg daily for depression.  3. Treat health problems as indicated.  4. Develop treatment plan to decrease risk of relapse upon discharge and the need for readmission.  5. Psycho-social education regarding relapse prevention and self care.  6. Health care follow up as needed for medical problems.  7. Review, reconcile, and reinstate any pertinent home medications for other health issues where appropriate. 8. Call for consults with hospitalist for any additional specialty patient care services as needed.  Treatment Plan Summary: Daily contact with patient to assess and evaluate symptoms and progress in treatment Medication management  Current Medications:  Current Facility-Administered Medications  Medication Dose Route Frequency Provider Last Rate Last Dose  . acetaminophen (TYLENOL) tablet 500 mg  500 mg Oral Q6H PRN Encarnacion Slates, NP      . alum & mag hydroxide-simeth (MAALOX/MYLANTA) 200-200-20 MG/5ML suspension 30 mL  30 mL Oral Q4H PRN Laverle Hobby, PA-C      . chlordiazePOXIDE (LIBRIUM) capsule 25 mg  25 mg Oral Q6H PRN Laverle Hobby, PA-C      . chlordiazePOXIDE (LIBRIUM) capsule 25 mg  25 mg Oral QID Laverle Hobby, PA-C   25 mg at 12/28/13 1202   Followed by  . [START ON 12/29/2013] chlordiazePOXIDE (LIBRIUM) capsule 25 mg  25 mg Oral TID Laverle Hobby, PA-C       Followed by  . [START ON 12/30/2013] chlordiazePOXIDE (LIBRIUM) capsule 25 mg  25 mg Oral BH-qamhs Spencer E Simon, PA-C       Followed by  . [START ON 12/31/2013] chlordiazePOXIDE (LIBRIUM) capsule 25 mg  25 mg Oral Daily Laverle Hobby, PA-C       . hydrOXYzine (ATARAX/VISTARIL) tablet 25 mg  25 mg Oral Q6H PRN Laverle Hobby, PA-C      . loperamide (IMODIUM) capsule 2-4 mg  2-4 mg Oral PRN Laverle Hobby, PA-C      . magnesium hydroxide (MILK OF MAGNESIA) suspension 30 mL  30 mL Oral Daily PRN Laverle Hobby, PA-C      . multivitamin with minerals tablet 1 tablet  1 tablet Oral Daily Laverle Hobby, PA-C   1 tablet at 12/28/13 0749  . nicotine (NICODERM CQ -  dosed in mg/24 hours) patch 21 mg  21 mg Transdermal Q0600 Encarnacion Slates, NP   21 mg at 12/28/13 7782  . ondansetron (ZOFRAN-ODT) disintegrating tablet 4 mg  4 mg Oral Q6H PRN Laverle Hobby, PA-C      . thiamine (B-1) injection 100 mg  100 mg Intramuscular Once Laverle Hobby, PA-C      . [START ON 12/29/2013] thiamine (VITAMIN B-1) tablet 100 mg  100 mg Oral Daily Spencer E Simon, PA-C      . traZODone (DESYREL) tablet 50 mg  50 mg Oral QHS,MR X 1 Laverle Hobby, PA-C        Observation Level/Precautions:  15 minute checks  Laboratory:  Per ED  Psychotherapy:  Group sessions  Medications: Librium detox   Consultations:  As needed  Discharge Concerns:  Sobriety  Estimated LOS: 2-4 days  Other:     I certify that inpatient services furnished can reasonably be expected to improve the patient's condition.   Encarnacion Slates, PMHNP-BC 3/26/201512:15 PM  Patient was seen face-to-face for psychiatric evaluation, suicide risk assessment and case discussed with the physician extender and formulated treatment plan. Reviewed the information documented and agree with the treatment plan.  Mizani Dilday,JANARDHAHA R. 12/28/2013 6:13 PM

## 2013-12-28 NOTE — Progress Notes (Signed)
0900 orientation group   The focus of this group is to educate the patient on the purpose and policies of crisis stabilization and provide a format to answer questions about their admission.  The group details unit policies and expectations of patients while admitted.   Pt was an active participant she was appropriate and cooperative.

## 2013-12-28 NOTE — [facility] Suicide Risk Assessment (Signed)
[facility] INPATIENT:  Family/Significant Other Suicide Prevention Education  Suicide Prevention Education:  Education Completed; Gardiner FantiChris Jamroz (pt's son) [phone] has been identified by the patient as the family member/significant other with whom the patient will be residing, and identified as the person(s) who will aid the patient in the event of a mental health crisis (suicidal ideations/suicide attempt).  With written consent from the patient, the family member/significant other has been provided the following suicide prevention education, prior to the and/or following the discharge of the patient.  The suicide prevention education provided includes the following:  Suicide risk factors  Suicide prevention and interventions  National Suicide Hotline telephone number  [facility] assessment telephone number  [facility] Emergency Assistance 911  [facility] and/or Residential Mobile Crisis Unit telephone number  Request made of family/significant other to:  Remove weapons (e.g., guns, rifles, knives), all items previously/currently identified as safety concern.    Remove drugs/medications (over-the-counter, prescriptions, illicit drugs), all items previously/currently identified as a safety concern.  The family member/significant other verbalizes understanding of the suicide prevention education information provided.  The family member/significant other agrees to remove the items of safety concern listed above.  Smart, Sumner Boesch LCSWA  12/28/2013, 12:55 PM

## 2013-12-28 NOTE — Progress Notes (Signed)
D. Pt has been up and visible in milieu this evening, attending and participating in various milieu activities. Pt has appeared flat, sad and dejected this evening. Pt has endorsed feelings of depression, did state that she is not feeling as bad as yesterday in regards to withdrawal. Pt has complained of back pain this afternoon and received lidocaine patch to help and pt did state that it was beneficial for her, and pt has received all other medications without incident. A. Support and encouragement provided, medication education given. R. Pt verbalized understanding, safety maintained.
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
## 2013-12-28 NOTE — Progress Notes (Signed)
Patient did attend the evening karaoke group. Pt was attentive and supportive. During the last song, pt became emotionally upset. Pt was comforted and seen back to the unit as she was crying.

## 2013-12-28 NOTE — B[facility] Group Notes (Signed)
BHH LCSW Group Therapy  12/28/2013 3:05 PM  Type of Therapy:  Group Therapy  Participation Level:  Active  Participation Quality:  Attentive  Affect:  Appropriate  Cognitive:  Alert and Oriented  Insight:  Engaged  Engagement in Therapy:  Engaged  Modes of Intervention:  Confrontation, Discussion, Education, Exploration, Problem-solving, Rapport Building, Socialization and Support  Summary of Progress/Problems: Group discussed balance in terms of different personality types such as passive, aggressive, and assertive. [name] was attentive and engaged throughout today's therapy group. She shared that she tends to be assertive but when she came to the hosptial she felt more passive. Oretta demonstrated improving insight AEB her ability to identify passive traits (isolating, letting people take advantage of her) that contributed to her increase in alcohol use and subsequent spiraling out of control. Amore stated that her biggest goal is to stop drinking in order to stop using alcohol as a crutch/negative coping skill. Concettina talked about her support system (family) and her desire to seek inpatient treatment and get her life back on track in order to become the assertive person that she once was.   Smart, Cerys Winget LCSWA  12/28/2013, 3:05 PM

## 2013-12-28 NOTE — ED Notes (Signed)
[facility] pick up patient to be transfer to [facility]

## 2013-12-28 NOTE — Progress Notes (Signed)
Patient ID: Danielle Villanueva, female   DOB: June 20, 1951, 63 y.o.   MRN: [id number] Admission note: D:Patient is a Involuntary admission in no acute distress for ETOH abuse, depression, and suicidal ideation with a plan to OD on heroin. Pt reports increased depression for over a month. Pt states she does not have any family support [facility] Pt report losing her job in February, 2015 and has unable to get on her feet. Pt reports drinking 12 or more cans of beer till she passes out. Pt endorses passive suicidal ideation but verbally contract for safety. Pt denies HI/AVH. Pt is cooperative with admission process. Pt reports hx of panic attacks and blackout but no seizures.  A: Pt admitted to unit per protocol, skin assessment and belonging search done. No skin issues noted. Consent signed by pt. Pt educated on therapeutic milieu rules. Pt was introduced to milieu by nursing staff. Fall risk safety plan explained to the patient. Meal and drink offered to pt - pt accepted. 15 minutes checks started for safety.  R: Pt was receptive to education. Writer offered support.

## 2013-12-28 NOTE — Tx Team (Signed)
Initial Interdisciplinary Treatment Plan  PATIENT STRENGTHS: (choose at least two) Average or above average intelligence Capable of independent living  PATIENT STRESSORS: Financial difficulties Substance abuse   PROBLEM LIST: Problem List/Patient Goals Date to be addressed Date deferred Reason deferred Estimated date of resolution  Etoh abuse 12/28/2013     depression 12/28/2013     suicidal ideation 12/28/2013     anxiety 12/28/2013                                    DISCHARGE CRITERIA:  Ability to meet basic life and health needs Adequate post-discharge living arrangements Improved stabilization in mood, thinking, and/or behavior Verbal commitment to aftercare and medication compliance Withdrawal symptoms are absent or subacute and managed without 24-hour nursing intervention  PRELIMINARY DISCHARGE PLAN: Attend aftercare/continuing care group Attend 12-step recovery group Outpatient therapy  PATIENT/FAMIILY INVOLVEMENT: This treatment plan has been presented to and reviewed with the patient, Danielle Villanueva, and/or family member,  The patient and family have been given the opportunity to ask questions and make suggestions.  Danielle Villanueva, Amery Minasyan K 12/28/2013, 4:10 AM

## 2013-12-28 NOTE — Progress Notes (Signed)
Pt has been up and active in the milieu today. She rates her depression a 9 hopelessness 10 and her anxiety a 10 on her self-inventory.  She denies any S/H ideation or A/V/H.  She is unsure of her plans at discharge.

## 2013-12-29 MED ORDER — LORAZEPAM 1 MG PO TABS: 1.0000 mg | ORAL_TABLET | Freq: Four times a day (QID) | ORAL | Status: DC | PRN

## 2013-12-29 MED ORDER — GABAPENTIN 100 MG PO CAPS
100.0000 mg | ORAL_CAPSULE | Freq: Three times a day (TID) | ORAL | Status: DC
  Administered 2013-12-29 – 2014-01-01 (×10): 100 mg via ORAL
  Filled 2013-12-29 (×17): qty 1

## 2013-12-29 MED ORDER — LORAZEPAM 1 MG PO TABS
1.0000 mg | ORAL_TABLET | Freq: Four times a day (QID) | ORAL | Status: DC | PRN
  Administered 2013-12-31: 1 mg via ORAL
  Filled 2013-12-29: qty 1

## 2013-12-29 MED ORDER — IBUPROFEN 200 MG PO TABS
400.0000 mg | ORAL_TABLET | Freq: Four times a day (QID) | ORAL | Status: DC | PRN
  Administered 2013-12-29 – 2014-01-01 (×6): 400 mg via ORAL
  Filled 2013-12-29: qty 2
  Filled 2013-12-29: qty 30
  Filled 2013-12-29 (×5): qty 2

## 2013-12-29 MED ORDER — LIDOCAINE 5 % EX PTCH
1.0000 | MEDICATED_PATCH | CUTANEOUS | Status: DC
  Administered 2013-12-30 – 2014-01-01 (×3): 1 via TRANSDERMAL
  Filled 2013-12-29 (×5): qty 1

## 2013-12-29 MED ORDER — LORAZEPAM 1 MG PO TABS
2.0000 mg | ORAL_TABLET | ORAL | Status: AC | PRN
  Administered 2013-12-29: 2 mg via ORAL
  Filled 2013-12-29: qty 2

## 2013-12-29 MED ORDER — BUPROPION HCL ER (XL) 150 MG PO TB24
150.0000 mg | ORAL_TABLET | Freq: Every day | ORAL | Status: DC
  Administered 2013-12-29 – 2014-01-01 (×4): 150 mg via ORAL
  Filled 2013-12-29 (×7): qty 1

## 2013-12-29 MED ORDER — LORAZEPAM 1 MG PO TABS
1.0000 mg | ORAL_TABLET | ORAL | Status: AC | PRN
  Administered 2013-12-30: 1 mg via ORAL
  Filled 2013-12-29: qty 1

## 2013-12-29 NOTE — Progress Notes (Signed)
D) Pt. Rates her depression and hopelessness both at a 1. Presentation of Pt. Is different. Affect is flat, sad depressed. No eye contact with this Clinical research associatewriter. Slow to respond to questions. A) Pt given support and reassurance. Allowed to stay back from lunch due to Pt feeling very sleepy. States she had not slept very much at all for the last two days. Encouraged to talk with the doctor to get something that will help her sleep tonight. R) Denies SI and HI. Affect and mood remain flat and depressed.

## 2013-12-29 NOTE — Clinical Social Work Note (Signed)
Pt tentatively accepted for admission into [facility] per [place]. [place]. If not, Trey PaulaJeff stated that ID and letter from CSW [place] be [place] (but pt SHOULD NOT bring a non-[place] Id with her to screening!). This letter is in pt chart. CSW must call Trey PaulaJeff on Monday to verify bed availability, as [facility]. If bed available for Tuesday, pt must will need taxi voucher in [facility] 8[facility] Trey Paula(Jeff cannot guarentee staff will be available to [facility] and will need 14 day med supply.   [address] Green[address] LaneHeather Smart, LCSWA 12/29/2013 3:34 PM

## 2013-12-29 NOTE — [facility] Group Notes (Signed)
[facility]   12/29/2013 9:57 AM  Participation Quality:  Appropriate   Mood/Affect:  Depressed and Flat  Depression Rating:  "I have hope. I don't have a number."   Anxiety Rating:  0  Thoughts of Suicide:  No Will you contract for safety?   NA  Current AVH:  No  Plan for Discharge/Comments:  [facility] Plan B is [facility]. Pt [place] [facility] [place]. Pt reporting moderate withdrawals-tremors/chills.   Transportation Means: unknown at this time.   Supports: son/daughter  Smart, OncologistHeather LCSWA

## 2013-12-29 NOTE — Progress Notes (Signed)
NUTRITION ASSESSMENT  Pt identified as at risk on the Malnutrition Screen Tool  INTERVENTION: 1. Educated patient on the importance of nutrition and encouraged intake of food and beverages. 2. Discussed weight goals. 3. Supplements: MVI and thiamine daily  NUTRITION DIAGNOSIS: Unintentional weight loss related to sub-optimal intake as evidenced by pt report.   Goal: Pt to meet >/= 90% of their estimated nutrition needs.  Monitor:  PO intake  Assessment:  Patient admitted with etoh abuse, depression and anxiety.  Patient states that appetite and intake are improving since admit and reports eating well.  States that she has not been eating well for >3 months secondary to "No food."  States that she is homeless.  UBW of 110 lbs 3 months ago.  Patient with a 9% weight loss in the past 3 months.  Patient does not want protein shakes at this time and is eating meals and snacks.  Patient meets criteria for moderate malnutrition related to social and environmental causes AEB intake <75% for > 3 months and decreased muscle mass.  63 y.o. female  Height: Ht Readings from Last 1 Encounters:  12/28/13 5\' 1"  (1.549 m)    Weight: Wt Readings from Last 1 Encounters:  12/28/13 101 lb (45.813 kg)    Weight Hx: Wt Readings from Last 10 Encounters:  12/28/13 101 lb (45.813 kg)    BMI:  Body mass index is 19.09 kg/(m^2). Pt meets criteria for normal weight based on current BMI.  Estimated Nutritional Needs: Kcal: 25-30 kcal/kg Protein: > 1 gram protein/kg Fluid: 1 ml/kcal  Diet Order: General Pt is also offered choice of unit snacks mid-morning and mid-afternoon.  Pt is eating as desired.   Lab results and medications reviewed.   Oran ReinLaura Yanna Leaks, RD, LDN Clinical Inpatient Dietitian Pager:  [phone] Weekend and after hours pager:  [phone]

## 2013-12-29 NOTE — Progress Notes (Signed)
[facility] MD Progress Note  12/29/2013 3:05 PM Danielle Villanueva  MRN:  [id number] Subjective: Danielle Villanueva is a 63 year old caucasian female who presents to the hospital for detoxification of Alcohol.  She reports past drinking as heavy with a reported use of 12 beers daily.  Patient verbalizes some symptoms of withdrawal in including chills, night sweats, and "trembling on the inside"  Noted fine motor tremor to hands bilaterally.   Patient states that her mood is better today although tearful when discussing loss of significant relationships with family.  States that she has not seen her grand daughter for many months as child now lives with biological father and paternal grandparents.  Patient states that she slept well last evening and has been attending all unit groups and programs.  Patient verbalizes significant craving for nicotine and would like to stop smoking for the long term.  Explored patient's willingness to quit, and discussed benefits, side effects of augmentation of current  nicotine replacement with Wellbutrin 172m daily.   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 Depression   . Anxiety    Axis IV: economic problems, housing problems, occupational problems, other psychosocial or environmental problems, problems related to social environment and problems with primary support group Axis V: 41-50 serious symptoms  ADL's:  Intact  Sleep: Good  Appetite:  Fair  Suicidal Ideation:  Denies  Homicidal Ideation:  Denies AEB (as evidenced by):  Psychiatric Specialty Exam: Physical Exam  Nursing note and vitals reviewed. Constitutional: She is oriented to person, place, and time. She appears  well-developed.  HENT:  Head: Normocephalic and atraumatic.  Musculoskeletal: Normal range of motion.  Neurological: She is alert and oriented to person, place, and time.  Skin: Skin is warm.    Review of Systems  Constitutional: Positive for chills and malaise/fatigue.  HENT: Negative.   Eyes: Negative.   Respiratory: Negative.   Cardiovascular: Negative.   Gastrointestinal: Negative.   Genitourinary: Negative.   Musculoskeletal: Positive for back pain and myalgias.  Skin: Negative.   Neurological: Positive for tremors.  Endo/Heme/Allergies: Negative.   Psychiatric/Behavioral: Positive for substance abuse.    Blood pressure 100/68, pulse 86, temperature 98.4 F (36.9 C), temperature source Oral, resp. rate 16, height _0  (1.549 m), weight 101 lb (45.813 kg).Body mass index is 19.09 kg/(m^2).  General Appearance: Casual and Well Groomed  EEngineer, water:  Fair  Speech:  Clear and Coherent and Normal Rate  Volume:  Normal  Mood:  Depressed  Affect:  Appropriate and Congruent  Thought Process:  Coherent, Goal Directed and Linear  Orientation:  Full (Time, Place, and Person)  Thought Content:  WDL  Suicidal Thoughts:  No  Homicidal Thoughts:  No  Memory:  Immediate;   Good Recent;   Good Remote;   Good  Judgement:  Fair  Insight:  Fair  Psychomotor Activity:  Tremor  Concentration:  Fair  Recall:  Good  Fund of Knowledge:Good  Language: Good  Akathisia:  No  Handed:  Right  AIMS (if indicated):     Assets:  Communication Skills Desire for Improvement  Sleep:  Number of Hours: 5.5   Musculoskeletal: Strength & Muscle Tone: within normal limits Gait & Station: normal Patient leans: N/A  Current Medications: Current Facility-Administered Medications  Medication Dose Route Frequency Provider Last Rate Last Dose  . alum & mag hydroxide-simeth (MAALOX/MYLANTA) 200-200-20 MG/5ML suspension 30 mL  30 mL Oral Q4H PRN Laverle Hobby, PA-C      . buPROPion (WELLBUTRIN  XL) 24 hr tablet 150 mg  150 mg Oral Daily Waylan Boga, NP      . chlordiazePOXIDE (LIBRIUM) capsule 25 mg  25 mg Oral Q6H PRN Laverle Hobby, PA-C      . chlordiazePOXIDE (LIBRIUM) capsule 25 mg  25 mg Oral TID Laverle Hobby, PA-C   25 mg at 12/29/13 1210   Followed by  . [START ON 12/30/2013] chlordiazePOXIDE (LIBRIUM) capsule 25 mg  25 mg Oral BH-qamhs Spencer E Simon, PA-C       Followed by  . [START ON 12/31/2013] chlordiazePOXIDE (LIBRIUM) capsule 25 mg  25 mg Oral Daily Laverle Hobby, PA-C      . hydrOXYzine (ATARAX/VISTARIL) tablet 25 mg  25 mg Oral Q6H PRN Laverle Hobby, PA-C      . lidocaine (LIDODERM) 5 % 1 patch  1 patch Transdermal Q24H Encarnacion Slates, NP   1 patch at 12/28/13 1709  . loperamide (IMODIUM) capsule 2-4 mg  2-4 mg Oral PRN Laverle Hobby, PA-C      . magnesium hydroxide (MILK OF MAGNESIA) suspension 30 mL  30 mL Oral Daily PRN Laverle Hobby, PA-C      . methocarbamol (ROBAXIN) tablet 500 mg  500 mg Oral Q8H PRN Encarnacion Slates, NP      . mirtazapine (REMERON) tablet 15 mg  15 mg Oral QHS Encarnacion Slates, NP   15 mg at 12/28/13 2133  . multivitamin with minerals tablet 1 tablet  1 tablet Oral Daily Laverle Hobby, PA-C   1 tablet at 12/29/13 0805  . nicotine (NICODERM CQ - dosed in mg/24 hours) patch 21 mg  21 mg Transdermal Q0600 Encarnacion Slates, NP   21 mg at 12/29/13 6283  . ondansetron (ZOFRAN-ODT) disintegrating tablet 4 mg  4 mg Oral Q6H PRN Laverle Hobby, PA-C      . thiamine (B-1) injection 100 mg  100 mg Intramuscular Once [employer], PA-C      . thiamine (VITAMIN B-1) tablet 100 mg  100 mg Oral Daily Laverle Hobby, PA-C   100 mg at 12/29/13 0805  . traZODone (DESYREL) tablet 50 mg  50 mg Oral QHS,MR X 1 Laverle Hobby, PA-C   50 mg at 12/28/13 2133    Lab Results:  Results for orders placed during the hospital encounter of 12/27/13 (from the past 48 hour(s))  ACETAMINOPHEN LEVEL     Status: None   Collection Time    12/27/13  5:15 PM       Result Value Ref Range   Acetaminophen (Tylenol), Serum <15.0  10 - 30 ug/mL   Comment:            THERAPEUTIC CONCENTRATIONS VARY     SIGNIFICANTLY. A RANGE OF 10-30     ug/mL MAY BE AN EFFECTIVE     CONCENTRATION FOR MANY PATIENTS.     HOWEVER, SOME ARE BEST TREATED     AT CONCENTRATIONS OUTSIDE THIS     RANGE.     ACETAMINOPHEN CONCENTRATIONS     >150 ug/mL AT 4 HOURS AFTER     INGESTION AND >50 ug/mL AT 12     HOURS AFTER INGESTION ARE  OFTEN ASSOCIATED WITH TOXIC     REACTIONS.  CBC     Status: None   Collection Time    12/27/13  5:15 PM      Result Value Ref Range   WBC 7.1  4.0 - 10.5 K/uL   RBC 4.21  3.87 - 5.11 MIL/uL   Hemoglobin 13.5  12.0 - 15.0 g/dL   HCT 40.3  36.0 - 46.0 %   MCV 95.7  78.0 - 100.0 fL   MCH 32.1  26.0 - 34.0 pg   MCHC 33.5  30.0 - 36.0 g/dL   RDW 15.0  11.5 - 15.5 %   Platelets 366  150 - 400 K/uL  COMPREHENSIVE METABOLIC PANEL     Status: Abnormal   Collection Time    12/27/13  5:15 PM      Result Value Ref Range   Sodium 140  137 - 147 mEq/L   Potassium 3.9  3.7 - 5.3 mEq/L   Chloride 101  96 - 112 mEq/L   CO2 21  19 - 32 mEq/L   Glucose, Bld 106 (*) 70 - 99 mg/dL   BUN 4 (*) 6 - 23 mg/dL   Creatinine, Ser 0.44 (*) 0.50 - 1.10 mg/dL   Calcium 9.6  8.4 - 10.5 mg/dL   Total Protein 6.6  6.0 - 8.3 g/dL   Albumin 3.8  3.5 - 5.2 g/dL   AST 93 (*) 0 - 37 U/L   ALT 75 (*) 0 - 35 U/L   Alkaline Phosphatase 88  39 - 117 U/L   Total Bilirubin <0.2 (*) 0.3 - 1.2 mg/dL   GFR calc non Af Amer >90  >90 mL/min   GFR calc Af Amer >90  >90 mL/min   Comment: (NOTE)     The eGFR has been calculated using the CKD EPI equation.     This calculation has not been validated in all clinical situations.     eGFR's persistently <90 mL/min signify possible Chronic Kidney     Disease.  ETHANOL     Status: Abnormal   Collection Time    12/27/13  5:15 PM      Result Value Ref Range   Alcohol, Ethyl (B) 76 (*) 0 - 11 mg/dL   Comment:            LOWEST  DETECTABLE LIMIT FOR     SERUM ALCOHOL IS 11 mg/dL     FOR MEDICAL PURPOSES ONLY  SALICYLATE LEVEL     Status: Abnormal   Collection Time    12/27/13  5:15 PM      Result Value Ref Range   Salicylate Lvl <1.6 (*) 2.8 - 20.0 mg/dL  URINE RAPID DRUG SCREEN (HOSP PERFORMED)     Status: None   Collection Time    12/27/13  5:37 PM      Result Value Ref Range   Opiates NONE DETECTED  NONE DETECTED   Cocaine NONE DETECTED  NONE DETECTED   Benzodiazepines NONE DETECTED  NONE DETECTED   Amphetamines NONE DETECTED  NONE DETECTED   Tetrahydrocannabinol NONE DETECTED  NONE DETECTED   Barbiturates NONE DETECTED  NONE DETECTED   Comment:            DRUG SCREEN FOR MEDICAL PURPOSES     ONLY.  IF CONFIRMATION IS NEEDED     FOR ANY PURPOSE, NOTIFY LAB     WITHIN 5 DAYS.  LOWEST DETECTABLE LIMITS     FOR URINE DRUG SCREEN     Drug Class       Cutoff (ng/mL)     Amphetamine      1000     Barbiturate      200     Benzodiazepine   923     Tricyclics       414     Opiates          300     Cocaine          300     THC              50    Physical Findings: AIMS: Facial and Oral Movements Muscles of Facial Expression: None, normal Lips and Perioral Area: None, normal Jaw: None, normal Tongue: None, normal,Extremity Movements Upper (arms, wrists, hands, fingers): None, normal Lower (legs, knees, ankles, toes): None, normal, Trunk Movements Neck, shoulders, hips: None, normal, Overall Severity Severity of abnormal movements (highest score from questions above): None, normal Incapacitation due to abnormal movements: None, normal Patient's awareness of abnormal movements (rate only patient's report): No Awareness, Dental Status Current problems with teeth and/or dentures?: No Does patient usually wear dentures?: No  CIWA:  CIWA-Ar Total: 0 COWS:     Treatment Plan Summary: Daily contact with patient to assess and evaluate symptoms and progress in treatment Medication  management  Plan:  Review of chart, vital signs, medications, and notes. 1-Individual and group therapy 2-Medication management for depression and anxiety:  Medications reviewed with the patient and Wellbutrin 150 mg daily for depression started due to depression and desire to stop smoking, Ativan taper for alcohol detox utilized due to liver concerns 3-Coping skills for depression and alcohol dependency 4-Continue crisis stabilization and management 5-Address health issues--monitoring vital signs, stable 6-Treatment plan in progress to prevent relapse of depression and substance abuse  Medical Decision Making Problem Points:  Established problem, stable/improving (1) and Review of psycho-social stressors (1) Data Points:  Review or order medicine tests (1)  I certify that inpatient services furnished can reasonably be expected to improve the patient's condition.   Waylan Boga, [place] 12/29/2013, 3:05 PM Agree with assessment and plan Geralyn Flash A. Sabra Heck, M.D.

## 2013-12-29 NOTE — Progress Notes (Signed)
Adult Psychoeducational Group Note  Date:  12/29/2013 Time:  11:40 AM  Group Topic/Focus:  Relapse Prevention Planning:   The focus of this group is to define relapse and discuss the need for planning to combat relapse.  Participation Level:  Active  Participation Quality:  Appropriate, Sharing and Supportive  Affect:  Appropriate  Cognitive:  Alert and Appropriate  Insight: Appropriate  Engagement in Group:  Engaged  Modes of Intervention:  Support  Additional Comments:    Lauralee Evenerowlin, Jenna Ardoin Jvette 12/29/2013, 11:40 AM

## 2013-12-29 NOTE — [facility] Group Notes (Signed)
[facility] LCSW Group Therapy  12/29/2013 2:53 PM  Type of Therapy:  Group Therapy  Participation Level:  Active  Participation Quality:  Attentive  Affect:  Appropriate  Cognitive:  Alert and Oriented  Insight:  Engaged  Engagement in Therapy:  Engaged  Modes of Intervention:  Confrontation, Discussion, Education, Exploration, Problem-solving, Rapport Building, Socialization and Support  Summary of Progress/Problems: Feelings around Relapse. Group members discussed the meaning of relapse and shared personal stories of relapse, how it affected them and others, and how they perceived themselves during this time. Group members were encouraged to identify triggers, warning signs and coping skills used when facing the possibility of relapse. Social supports were discussed and explored in detail. Post Acute Withdrawal Syndrome (handout provided) was introduced and examined. Pt's were encouraged to ask questions, talk about key points associated with PAWS, and process this information in terms of relapse prevention. [name] was attentive and engaged throughout today's therapy group. [place] shared that she was scared about dealing with two years of negative symptoms associated with PAWS. [name] shows progress in the group setting and improving insight AEB her ability to process how having a safety plan in place, involving her support system in this safety plan, and going into treatment for as long as possible will help her avoid relapse and be successful in recovery.   Smart, Geno Sydnor LCSWA  12/29/2013, 2:53 PM

## 2013-12-29 NOTE — Tx Team (Signed)
Interdisciplinary Treatment Plan Update (Adult)  Date: 12/29/2013   Time Reviewed: 11:35 AM  Progress in Treatment:  Attending groups: Yes  Participating in groups:  Yes  Taking medication as prescribed: Yes  Tolerating medication: Yes  Family/Significant othe contact made: SPE completed with pt's son.  Patient understands diagnosis: Yes, AEB seeking treatment for ETOH detox, SI with plan, mood stabilization, and medication management.  Discussing patient identified problems/goals with staff: Yes  Medical problems stabilized or resolved: Yes  Denies suicidal/homicidal ideation: Yes during group/self report.  Patient has not harmed self or Others: Yes  New problem(s) identified:  Discharge Plan or Barriers: Pt hoping for admission into [facility] and eventual admission into [facility] program in [place]. CSW assessing.  Additional comments: Danielle Villanueva is 63 years old, Caucasian female. She reports, "My cousin took me to the hospital because of my bad depression, anxiety and alcoholism. I drink because I have no job, no home and no car. [place] 1st of this (year) 2015. I drink to cope. Alcohol is my bandaid to my problems. I feel broken. I'm ashamed of me. I have been drinking heavily x 10 weeks. I drink about 12 cans of beer daily. I have been an alcoholic x 10 years. I have no support system. I feel very depressed and suicidal". Danielle Villanueva denies any intent and or plans to hurt self and or others. Reason for Continuation of Hospitalization: Librium taper-withdrawals Mood stabilization Medication management  Estimated length of stay: 3-4 days  For review of initial/current patient goals, please see plan of care.  Attendees:  Patient:    Family:    Physician: Geoffery LyonsIrving Lugo MD 12/29/2013 11:34 AM   Nursing: Thayer Ohmhris RN  12/29/2013 11:34 AM   Clinical Social Worker [name], LCSWA  12/29/2013 11:34 AM   Other: Darden DatesJennifer C. Nurse CM  12/29/2013 11:34 AM   Other:    Other:  Massie Kluverelores Sutton, Community Care Coordinator  12/29/2013 11:34 AM   Other:    Scribe for Treatment Team:  The Sherwin-WilliamsHeather Villanueva LCSWA 12/29/2013 11:34 AM

## 2013-12-30 DIAGNOSIS — F1010 Alcohol abuse, uncomplicated: Secondary | ICD-10-CM | POA: Diagnosis present

## 2013-12-30 DIAGNOSIS — F1020 Alcohol dependence, uncomplicated: Secondary | ICD-10-CM | POA: Diagnosis present

## 2013-12-30 MED ORDER — NALTREXONE HCL 50 MG PO TABS: 25.0000 mg | ORAL_TABLET | Freq: Every day | ORAL | Status: DC

## 2013-12-30 NOTE — Progress Notes (Signed)
[facility]  12/30/2013 9:55 AM Danielle Villanueva  MRN:  [id number] Subjective:  Danielle Villanueva is a 63 year old Caucasian female admitted for ETOH abuse and dependence.  Patient relates that she slept well last evening but feels groggy this am. Feels that medication for sleep was too potent and states made her unsteady in her gait when she got up to use the bathroom in the middle of the night.  Patient denies feelings of nausea,  Does endorse mild tremors to both hands. Continues to report craving for both ETOH and nicotine.  Discussed plans for maintenance of sobriety.  Patient states that she knows she needs to quit drinking and smoking. States that she needs to improve her "mental control" and  Willing to attend AA and find support from a sponsor.  Patient reports mood as stable.  No SI/HI. Endorses mild irritability associated with wanting a cigarette and low energy.  Continue current plan will discontinue trazodone dosage to prevent dizziness, Remeron is still in place.   Diagnosis:   DSM5: Substance/Addictive Disorders:  Alcohol Related Disorder - Severe (303.90) and Alcohol Withdrawal (291.81) Depressive Disorders:  Major Depressive Disorder - Unspecified (296.20) Total Time spent with patient: 20 minutes  Axis I: Substance Abuse and Substance Induced Mood Disorder Axis II: No diagnosis Axis III:  Past Medical History  Diagnosis Date  . Depression   . Anxiety    Axis IV: economic problems, housing problems and problems with primary support group Axis V: 41-50 serious symptoms  ADL's:  Intact  Sleep: Good  Appetite:  Good  Suicidal Ideation:  Plan:  denies  Intent:  denies Means:  denies Homicidal Ideation:  Plan:  denies Intent:  denies Means:  denies AEB (as evidenced by):  Psychiatric Specialty Exam: Physical Exam  Nursing note and vitals reviewed. Constitutional: She is oriented to person, place, and time. She appears well-developed.  HENT:  Head: Normocephalic and  atraumatic.  Neck: Normal range of motion.  Respiratory: Effort normal.  Musculoskeletal: Normal range of motion.  Neurological: She is alert and oriented to person, place, and time.  Skin: Skin is warm and dry.  Psychiatric: She has a normal mood and affect.    Review of Systems  Constitutional: Negative.   Eyes: Negative.   Respiratory: Negative.   Cardiovascular: Negative.   Gastrointestinal: Negative.   Genitourinary: Negative.   Musculoskeletal: Positive for myalgias.  Skin: Negative.   Neurological: Positive for tremors.       States was a bit unsteady in gait upon awakening in middle of night.   Endo/Heme/Allergies: Negative.   Psychiatric/Behavioral: Positive for substance abuse.    Blood pressure 101/71, pulse 66, temperature 97.5 F (36.4 C), temperature source Oral, resp. rate 18, height 5\' 1"  (1.549 m), weight 101 lb (45.813 kg).Body mass index is 19.09 kg/(m^2).  General Appearance: Neat and Well Groomed  Patent attorneyye Contact::  Good  Speech:  Clear and Coherent  Volume:  Normal  Mood:  Irritable and regarding craving but otherwise reports euthymic  Affect:  Appropriate and Congruent  Thought Process:  Coherent, Goal Directed and Linear  Orientation:  Full (Time, Place, and Person)  Thought Content:  WDL  Suicidal Thoughts:  No  Homicidal Thoughts:  No  Memory:  Immediate;   Good Recent;   Good Remote;   Good  Judgement:  Fair  Insight:  Fair  Psychomotor Activity:  Normal  Concentration:  Fair  Recall:  Good  Fund of Knowledge:Good  Language: Good  Akathisia:  No  Handed:  Right  AIMS (if indicated):     Assets:  Communication Skills Desire for Improvement  Sleep:  Number of Hours: 6.75   Musculoskeletal: Strength & Muscle Tone: within normal limits Gait & Station: unsteady following administration of trazadone Patient leans: N/A  Current Medications: Current Facility-Administered Medications  Medication Dose Route Frequency Provider Last Rate Last Dose   . alum & mag hydroxide-simeth (MAALOX/MYLANTA) 200-200-20 MG/5ML suspension 30 mL  30 mL Oral Q4H PRN Kerry Hough, PA-C      . buPROPion (WELLBUTRIN XL) 24 hr tablet 150 mg  150 mg Oral Daily Nanine Means, NP   150 mg at 12/30/13 0817  . gabapentin (NEURONTIN) capsule 100 mg  100 mg Oral TID Rachael Fee, MD   100 mg at 12/30/13 1610  . hydrOXYzine (ATARAX/VISTARIL) tablet 25 mg  25 mg Oral Q6H PRN Kerry Hough, PA-C      . ibuprofen (ADVIL,MOTRIN) tablet 400 mg  400 mg Oral Q6H PRN Rachael Fee, MD   400 mg at 12/29/13 2151  . lidocaine (LIDODERM) 5 % 1 patch  1 patch Transdermal Q24H Rachael Fee, MD   1 patch at 12/30/13 0818  . loperamide (IMODIUM) capsule 2-4 mg  2-4 mg Oral PRN Kerry Hough, PA-C      . LORazepam (ATIVAN) tablet 1 mg  1 mg Oral Q4H PRN Nanine Means, NP      . Melene Muller ON 12/31/2013] LORazepam (ATIVAN) tablet 1 mg  1 mg Oral Q6H PRN Nanine Means, NP      . LORazepam (ATIVAN) tablet 2 mg  2 mg Oral Q4H PRN Nanine Means, NP   2 mg at 12/29/13 1707  . magnesium hydroxide (MILK OF MAGNESIA) suspension 30 mL  30 mL Oral Daily PRN Kerry Hough, PA-C      . methocarbamol (ROBAXIN) tablet 500 mg  500 mg Oral Q8H PRN Sanjuana Kava, NP      . mirtazapine (REMERON) tablet 15 mg  15 mg Oral QHS Sanjuana Kava, NP   15 mg at 12/29/13 2119  . multivitamin with minerals tablet 1 tablet  1 tablet Oral Daily Kerry Hough, PA-C   1 tablet at 12/30/13 0817  . nicotine (NICODERM CQ - dosed in mg/24 hours) patch 21 mg  21 mg Transdermal Q0600 Rachael Fee, MD   21 mg at 12/30/13 0818  . ondansetron (ZOFRAN-ODT) disintegrating tablet 4 mg  4 mg Oral Q6H PRN Kerry Hough, PA-C      . thiamine (B-1) injection 100 mg  100 mg Intramuscular Once [employer], PA-C      . thiamine (VITAMIN B-1) tablet 100 mg  100 mg Oral Daily Kerry Hough, PA-C   100 mg at 12/30/13 0817  . traZODone (DESYREL) tablet 50 mg  50 mg Oral QHS,MR X 1 Kerry Hough, PA-C   50 mg at 12/29/13 2119     Lab Results: No results found for this or any previous visit (from the past 48 hour(s)).  Physical Findings: AIMS: Facial and Oral Movements Muscles of Facial Expression: None, normal Lips and Perioral Area: None, normal Jaw: None, normal Tongue: None, normal,Extremity Movements Upper (arms, wrists, hands, fingers): None, normal Lower (legs, knees, ankles, toes): None, normal, Trunk Movements Neck, shoulders, hips: None, normal, Overall Severity Severity of abnormal movements (highest score from questions above): None, normal Incapacitation due to abnormal movements: None, normal Patient's awareness of abnormal  movements (rate only patient's report): No Awareness, Dental Status Current problems with teeth and/or dentures?: No Does patient usually wear dentures?: No  CIWA:  CIWA-Ar Total: 2 COWS:     Treatment Plan Summary: Daily contact with patient to assess and evaluate symptoms and progress in treatment Medication management  Plan:  Review of chart, vital signs, medications, and notes. 1-Individual and group therapy 2-Medication management for depression and alcohol detox:  Medications reviewed with the patient and her Trazodone discontinued, too much with the Remeron 3-Coping skills for depression and alcohol abuse 4-Continue crisis stabilization and management 5-Address health issues--monitoring vital signs, stable 6-Treatment plan in progress to prevent relapse of depression and alcohol use  Medical Decision Making Problem Points:  Established problem, stable/improving (1) Data Points:  Review of new medications or change in dosage (2)  I certify that inpatient services furnished can reasonably be expected to improve the patient's condition.   Nanine Means, PMH-NP 12/30/2013, 9:55 AM

## 2013-12-30 NOTE — [facility] Group Notes (Signed)
[facility] LCSW Group Therapy  12/30/2013 12:29 PM  Type of Therapy:  Group Therapy  Participation Level:  Active  Participation Quality:  Attentive, Sharing and Supportive  Affect:  Appropriate  Cognitive:  Alert and Oriented  Insight:  Developing/Improving  Engagement in Therapy:  Engaged  Modes of Intervention:  Discussion, Exploration and Problem-solving  Summary of Progress/Problems:  Today's group consisted of a conversation around Supportive Framework: What is a supportive framework? What does it look like feel like and how do I discern it from and unhealthy non-supportive network? Learn how to cope when supports are not helpful and don't support you. Discuss what to do when your family/friends are not supportive.  Danielle Villanueva was interactive and brought humor to the group with her matter of fact answers and sly remarks. Danielle Villanueva shares she struggles with letting people in to help, because she does not know if they truly want to hear her problems and help her.  She has people who call and check on her, but never talk about her issues, but rather there own problems or the latest gossip.   Danielle Villanueva reports she has one friend whom she has always trusted and will continue to be a positive person in her life, but she is more happy doing things alone and being independent.    Danielle Villanueva, Danielle Villanueva 12/30/2013, 12:29 PM

## 2013-12-30 NOTE — Progress Notes (Signed)
Pt attended the evening AA group. Following the group, she approached the Writer to make sure her attendance was documented and to express that "I don't like being woken up from my sleep and forced to attend these meetings. Make sure they know I obeyed!" Pt was reminded that the Writer merely extended an invitation to her for the evening AA Group (specifically, the Writer said, "We're about to have AA in the Dayroom. You're welcome to join us,") and that she was not forced to attend the group. Pt was also reminded that group attendance was crucial part of her Treatment Agreement. The Writer also encouraged the Pt to speak with Nursing Staff if she felt unable to attend any future groups.  In the hour since group ended, Pt's affect has been rude and irritable.

## 2013-12-30 NOTE — Progress Notes (Signed)
Patient ID: Danielle Villanueva, female   DOB: 04-11-1951, 63 y.o.   MRN: [id number] D)  Has been somewhat irritable this evening, came to med window asking about a new med she said she had been put on for smoking, later came back and said it was for pain.  Was given a nicotine patch, and was also medicated with Ibuprofen for lower back pain and was also given heat packs.  Did attend the AA meeting this evening, and had a snack before going to bed.  A)  Will continue to monitor for safety, continue POC R)  Safety maintained.

## 2013-12-30 NOTE — Progress Notes (Signed)
Patient ID: Danielle Villanueva, female   DOB: 03/14/1951, 63 y.o.   MRN: [id number]  D: Pt has been very flat and depressed on the unit today, pt reported that she was overmedicated last night and that she almost fail three times. Pt reported that she wanted her medication adjusted, Nanine MeansJamison Lord NP made aware of situation new orders were noted. Pt reported being negative SI/HI, no AH/VH noted. A: 15 min checks continued for patient safety. R: Pt safety maintained.

## 2013-12-30 NOTE — [facility] Group Notes (Signed)
[facility] Group Notes:  (Nursing/MHT/Case Management/Adjunct)  Date:  12/30/2013  Time:  10:19 AM  Type of Therapy:  Psychoeducational Skills  Participation Level:  Active  Participation Quality:  Appropriate  Affect:  Appropriate  Cognitive:  Appropriate  Insight:  Appropriate  Engagement in Group:  Engaged  Modes of Intervention:  Discussion  Summary of Progress/Problems: Pt did attend self inventory group, pt reported that she was negative SI/HI, no AH/VH noted. Pt rated her depression as a 9, and his helplessness/hopelessness as a 9.     Danielle BalintForrest, Danielle Villanueva 12/30/2013, 10:19 AM

## 2013-12-30 NOTE — [facility] Group Notes (Signed)
[facility] Group Notes:  (Nursing/MHT/Case Management/Adjunct)  Date:  12/30/2013  Time:  2:43 PM  Type of Therapy:  Psychoeducational Skills  Participation Level:  Active  Participation Quality:  Appropriate  Affect:  Appropriate  Cognitive:  Appropriate  Insight:  Appropriate  Engagement in Group:  Engaged  Modes of Intervention:  Discussion  Summary of Progress/Problems: Pt did attend healthy coping skills group, and also watched [employer] video that focused on behavior.      Jacquelyne BalintForrest, Aviyana Sonntag Shanta 12/30/2013, 2:43 PM

## 2013-12-31 DIAGNOSIS — F1910 Other psychoactive substance abuse, uncomplicated: Secondary | ICD-10-CM

## 2013-12-31 NOTE — Progress Notes (Signed)
Patient ID: Danielle Villanueva, female   DOB: November 06, 1950, 63 y.o.   MRN: [id number] D)  Has been isolative this evening, and has stayed in her room or in her bed, irritable.  Refused to come to group in spite of invitation.  Did admit to feeling anxious, shaky, a little nauseous, and general aching, although mainly in her back.  Was medicated for discomfort, and for w/d sx, was given heat packs for her back.  A snack and drink were taken to her. Refused her lidocaine patch, wants to wait until after shower in am. A)  Will continue to monitor q 15 minutes, will continue POC R)  Safety maintained.

## 2013-12-31 NOTE — [facility] Group Notes (Addendum)
[facility] Group Notes:  (Nursing/MHT/Case Management/Adjunct)  Date:  12/31/2013  Time:  9:56 AM  Type of Therapy:  Psychoeducational Skills  Participation Level:  Active  Participation Quality:  Appropriate  Affect:  Appropriate  Cognitive:  Appropriate  Insight:  Appropriate  Engagement in Group:  Engaged  Modes of Intervention:  Discussion  Summary of Progress/Problems: Pt did attend self inventory group, pt reported that she was negative SI/HI, no AH/VH noted. Pt rated her depression as a 7, and her helplessness/hopelessness as a 7.     Jacquelyne BalintForrest, Chanc Kervin Shanta 12/31/2013, 9:56 AM

## 2013-12-31 NOTE — [facility] Group Notes (Signed)
[facility] LCSW Group Therapy  12/31/2013 12:57 PM  Type of Therapy:  Group Therapy  Participation Level:  Did Not Attend-pt walked in a few minutes before group ended. She did not participate.   Smart, Kay Ricciuti LCSWA  12/31/2013, 12:57 PM

## 2013-12-31 NOTE — Progress Notes (Signed)
Pt was in bed upon first assessment.  She did wake up and came medication window and went back to bed.  She rated both her depression and hopelessness hr anxiety a 10 on her self inventory.  She denies any S/H ideation or A/V/H.  [facility].

## 2013-12-31 NOTE — [facility] Group Notes (Signed)
[facility] Group Notes:  (Nursing/MHT/Case Management/Adjunct)  Date:  12/31/2013  Time:  2:28 PM  Type of Therapy:  Psychoeducational Skills  Participation Level:  Did Not Attend  Rue Valladares Shanta 12/31/2013, 2:28 PM 

## 2013-12-31 NOTE — Progress Notes (Signed)
Patient ID: Danielle Villanueva, female   DOB: 1951/05/20, 63 y.o.   MRN: [id number] D)  Affect remains flat and depressed this evening, but seems a little less irritable, and was making more appropriate responses.  Attended AA group and participated, came to med window for hs meds, was also given heat packs for her back, which she said had helped last night.   Went to her room to read and go to bed. A)  Will continue to monitor for safety, continue POC R)  Safety maintained.

## 2013-12-31 NOTE — Progress Notes (Signed)
Patient ID: Danielle Villanueva, female   DOB: 1951/08/06, 63 y.o.   MRN: [id number] Danielle Villanueva [place] MD Progress Note  12/31/2013 9:52 PM Danielle Villanueva  MRN:  [id number] Subjective:  Met with the patient 1 on 1 today, and she reports that she is not happy that I have interrupted her devotional time to see her. She was watching [facility]an[facility] on the TV in the day room, but voiced no objection to meeting with this provider. She notes that she is feeling "fabulous" but has a constricted affect. She is reporting feeling better than upon her arrival and that her symptoms are diminishing, but she really doesn't want to hear the other patient's complaints during the group time. She is unhappy that the [place] speaker last night went over time, and that she had to attend the meeting when she didn't feel well at all.  She is tolerating her medication and is doing well otherwise. She is hoping to [facility] on discharge as she is currently homeless. Diagnosis:   DSM5: Substance/Addictive Disorders:  Alcohol Related Disorder - Severe (303.90) and Alcohol Withdrawal (291.81) Depressive Disorders:  Major Depressive Disorder - Unspecified (296.20) Total Time spent with patient: 20 minutes  Axis I: Substance Abuse and Substance Induced Mood Disorder Axis II: No diagnosis Axis III:  Past Medical History  Diagnosis Date  . Depression   . Anxiety    Axis IV: economic problems, housing problems and problems with primary support group Axis V: 41-50 serious symptoms  ADL's:  Intact  Sleep: Good  Appetite:  Good  Suicidal Ideation:  Plan:  denies  Intent:  denies Means:  denies Homicidal Ideation:  Plan:  denies Intent:  denies Means:  denies AEB (as evidenced by):  Psychiatric Specialty Exam: Physical Exam  Nursing note and vitals reviewed. Constitutional: She is oriented to person, place, and time. She appears well-developed.  HENT:  Head: Normocephalic and atraumatic.  Neck: Normal range of motion.   Respiratory: Effort normal.  Musculoskeletal: Normal range of motion.  Neurological: She is alert and oriented to person, place, and time.  Skin: Skin is warm and dry.  Psychiatric: She has a normal mood and affect.    Review of Systems  Constitutional: Negative.   Eyes: Negative.   Respiratory: Negative.   Cardiovascular: Negative.   Gastrointestinal: Negative.   Genitourinary: Negative.   Musculoskeletal: Positive for myalgias.  Skin: Negative.   Neurological: Positive for tremors.       States was a bit unsteady in gait upon awakening in middle of night.   Danielle/Heme/Allergies: Negative.   Psychiatric/Behavioral: Positive for substance abuse.    Blood pressure 95/65, pulse 67, temperature 97.6 F (36.4 C), temperature source Oral, resp. rate 18, height $RemoveBe'5\' 1"'VDqqPJwQj$  (1.549 m), weight 45.813 kg (101 lb).Body mass index is 19.09 kg/(m^2).  General Appearance: Neat and Well Groomed  Engineer, water::  Good  Speech:  Clear and Coherent  Volume:  Normal  Mood:  Irritable and regarding craving but otherwise reports euthymic  Affect:  Appropriate and Congruent  Thought Process:  Coherent, Goal Directed and Linear  Orientation:  Full (Time, Place, and Person)  Thought Content:  WDL  Suicidal Thoughts:  No  Homicidal Thoughts:  No  Memory:  Immediate;   Good Recent;   Good Remote;   Good  Judgement:  Fair  Insight:  Fair  Psychomotor Activity:  Normal  Concentration:  Fair  Recall:  Good  Fund of Knowledge:Good  Language: Good  Akathisia:  No  Handed:  Right  AIMS (if indicated):     Assets:  Communication Skills Desire for Improvement  Sleep:  Number of Hours: 6.75   Musculoskeletal: Strength & Muscle Tone: within normal limits Gait & Station: unsteady following administration of trazadone Patient leans: N/A  Current Medications: Current Facility-Administered Medications  Medication Dose Route Frequency Provider Last Rate Last Dose  . alum & mag hydroxide-simeth  (MAALOX/MYLANTA) 200-200-20 MG/5ML suspension 30 mL  30 mL Oral Q4H PRN Laverle Hobby, PA-C      . buPROPion (WELLBUTRIN XL) 24 hr tablet 150 mg  150 mg Oral Daily Waylan Boga, NP   150 mg at 12/31/13 0756  . gabapentin (NEURONTIN) capsule 100 mg  100 mg Oral TID Nicholaus Bloom, MD   100 mg at 12/31/13 1815  . ibuprofen (ADVIL,MOTRIN) tablet 400 mg  400 mg Oral Q6H PRN Nicholaus Bloom, MD   400 mg at 12/31/13 1507  . lidocaine (LIDODERM) 5 % 1 patch  1 patch Transdermal Q24H Nicholaus Bloom, MD   1 patch at 12/31/13 [phone]  . LORazepam (ATIVAN) tablet 1 mg  1 mg Oral Q6H PRN Waylan Boga, NP   1 mg at 12/31/13 2110  . magnesium hydroxide (MILK OF MAGNESIA) suspension 30 mL  30 mL Oral Daily PRN Laverle Hobby, PA-C      . methocarbamol (ROBAXIN) tablet 500 mg  500 mg Oral Q8H PRN Encarnacion Slates, NP   500 mg at 12/30/13 1717  . mirtazapine (REMERON) tablet 15 mg  15 mg Oral QHS Encarnacion Slates, NP   15 mg at 12/31/13 2111  . multivitamin with minerals tablet 1 tablet  1 tablet Oral Daily Laverle Hobby, PA-C   1 tablet at 12/31/13 0756  . nicotine (NICODERM CQ - dosed in mg/24 hours) patch 21 mg  21 mg Transdermal Q0600 Nicholaus Bloom, MD   21 mg at 12/31/13 0758  . thiamine (B-1) injection 100 mg  100 mg Intramuscular Once [employer], PA-C      . thiamine (VITAMIN B-1) tablet 100 mg  100 mg Oral Daily Laverle Hobby, PA-C   100 mg at 12/31/13 9622    Lab Results: No results found for this or any previous visit (from the past 48 hour(s)).  Physical Findings: AIMS: Facial and Oral Movements Muscles of Facial Expression: None, normal Lips and Perioral Area: None, normal Jaw: None, normal Tongue: None, normal,Extremity Movements Upper (arms, wrists, hands, fingers): None, normal Lower (legs, knees, ankles, toes): None, normal, Trunk Movements Neck, shoulders, hips: None, normal, Overall Severity Severity of abnormal movements (highest score from questions above): None, normal Incapacitation  due to abnormal movements: None, normal Patient's awareness of abnormal movements (rate only patient's report): No Awareness, Dental Status Current problems with teeth and/or dentures?: No Does patient usually wear dentures?: No  CIWA:  CIWA-Ar Total: 1 COWS:     Treatment Plan Summary: Daily contact with patient to assess and evaluate symptoms and progress in treatment Medication management  Plan:  Review of chart, vital signs, medications, and notes. 1-Individual and group therapy 2-Medication management for depression and alcohol detox:  Medications reviewed with the patient and her Trazodone discontinued, too much with the Remeron 3-Coping skills for depression and alcohol abuse 4-Continue crisis stabilization and management 5-Address health issues--monitoring vital signs, stable 6-Treatment plan in progress to prevent relapse of depression and alcohol use  Medical Decision Making Problem Points:  Established problem, stable/improving (1) Data  Points:  Review of new medications or change in dosage (2)  I certify that inpatient services furnished can reasonably be expected to improve the patient's condition.  Marlane Hatcher. [facility] 12/31/2013 9:55 PM I agreed with the findings, treatment and disposition plan of this patient. Berniece Andreas, MD

## 2013-12-31 NOTE — Progress Notes (Signed)
Patient did attend the evening speaker AA meeting.  

## 2013-12-31 NOTE — Progress Notes (Signed)
Adult Psychoeducational Group Note  Date:  12/31/2013 Time:  10:20 AM  Group Topic/Focus:  Healthy Communication:   The focus of this group is to discuss communication, barriers to communication, as well as healthy ways to communicate with others.  Participation Level:  Active  Participation Quality:  Sharing  Affect:  Appropriate  Cognitive:  Alert  Insight: Good  Engagement in Group:  Engaged  Modes of Intervention:  Education  Additional Comments:  Pt stated that she was continuing to work on her book to published about the ten commandments and once the book is finally published she would be free from her addiction and her life would be so complete.  Brayla Pat H 12/31/2013, 10:20 AM

## 2014-01-01 MED ORDER — MIRTAZAPINE 15 MG PO TABS: 15.0000 mg | ORAL_TABLET | Freq: Every day | ORAL | Status: DC

## 2014-01-01 MED ORDER — IBUPROFEN 200 MG PO TABS: 400.0000 mg | ORAL_TABLET | Freq: Four times a day (QID) | ORAL | Status: DC | PRN

## 2014-01-01 MED ORDER — GABAPENTIN 100 MG PO CAPS: 100.0000 mg | ORAL_CAPSULE | Freq: Three times a day (TID) | ORAL | Status: DC

## 2014-01-01 MED ORDER — BUPROPION HCL ER (XL) 150 MG PO TB24: 150.0000 mg | ORAL_TABLET | Freq: Every day | ORAL | Status: DC

## 2014-01-01 NOTE — Discharge Summary (Signed)
Physician Discharge Summary Note  Patient:  Danielle Villanueva is an 63 y.o., female MRN:  [id number] DOB:  Jul 16, 1951 Patient phone:  [phone] (home)  Patient address:   [address] [place] [address],  Total Time spent with patient: Greater than 30 mintues  Date of Admission:  12/28/2013  Date of Discharge:01/02/14  Reason for Admission: Alcohol detox  Discharge Diagnoses: Principal Problem:   Alcohol dependence Active Problems:   Alcohol-induced mood disorder   Alcohol abuse   Psychiatric Specialty Exam: Physical Exam  Constitutional: She is oriented to person, place, and time. She appears well-developed.  HENT:  Head: Normocephalic.  Eyes: Pupils are equal, round, and reactive to light.  Neck: Normal range of motion.  Cardiovascular: Normal rate.   Respiratory: Effort normal.  GI: Soft.  Genitourinary:  Denies any issues in this area  Musculoskeletal: Normal range of motion.  Neurological: She is alert and oriented to person, place, and time.  Skin: Skin is warm and dry.  Psychiatric: Her behavior is normal. Judgment and thought content normal. Her mood appears not anxious. Her affect is not angry, not blunt and not labile. Cognition and memory are normal. She does not exhibit a depressed mood.    Review of Systems  Constitutional: Negative.   HENT: Negative.   Eyes: Negative.   Respiratory: Negative.   Cardiovascular: Negative.   Gastrointestinal: Negative.   Genitourinary: Negative.   Musculoskeletal: Negative.   Skin: Negative.   Neurological: Negative.   Endo/Heme/Allergies: Negative.   Psychiatric/Behavioral: Positive for depression (Stable) and substance abuse (Alcoholism). Negative for suicidal ideas, hallucinations and memory loss. The patient is not nervous/anxious and does not have insomnia.     Blood pressure 134/85, pulse 99, temperature 99.1 F (37.3 C), temperature source Oral, resp. rate 18, height 5\' 1"  (1.549 m), weight 45.813 kg (101  lb).Body mass index is 19.09 kg/(m^2).  General Appearance: Casual and Fairly Groomed  Eye Contact::  Good  Speech:  Clear and Coherent  Volume:  Normal  Mood:  Stable  Affect:  Appropriate and Congruent  Thought Process:  Coherent, Goal Directed and Logical  Orientation:  Full (Time, Place, and Person)  Thought Content:  Denies any psychotic symptoms  Suicidal Thoughts:  No  Homicidal Thoughts:  No  Memory:  Immediate;   Good Recent;   Good Remote;   Good  Judgement:  Good  Insight:  Present  Psychomotor Activity:  Normal  Concentration:  Good  Recall:  Good  Fund of Knowledge:Fair  Language: Good  Akathisia:  No  Handed:  Right  AIMS (if indicated):     Assets:  Communication Skills Desire for Improvement  Sleep:  Number of Hours: 4.75    Past Psychiatric History: Diagnosis: Alcohol Related Disorder - Severe (303.90), Substance induced mood disorder  Hospitalizations: [facility] adult units,  Outpatient Care: [facility]  Substance Abuse Care: [facility]  Self-Mutilation: NA  Suicidal Attempts: NA  Violent Behaviors: NA   Musculoskeletal: Strength & Muscle Tone: within normal limits Gait & Station: normal Patient leans: N/A  DSM5: Schizophrenia Disorders:  NA Obsessive-Compulsive Disorders:  NA Trauma-Stressor Disorders:  NA Substance/Addictive Disorders:  Alcohol Related Disorder - Severe (303.90) Depressive Disorders: Substance induced mood disorder  Axis Diagnosis:   AXIS I:  Alcohol Related Disorder - Severe (303.90), Substance induced mood disorder AXIS II:  Deferred AXIS III:   Past Medical History  Diagnosis Date  . Depression   . Anxiety    AXIS IV:  other  psychosocial or environmental problems and Alcoholism, chronic AXIS V:  62  Level of Care:  RTC  Hospital Course: Danielle Villanueva is 63 years old, Caucasian female. She reports, "My cousin took me to the hospital because of my bad depression, anxiety and alcoholism. I drink  because I have no job, no home and no car. I lived in [place] from December thru March 1st of this (year) 2015. I drink to cope. Alcohol is my bandaid to my problems. I feel broken. I'm ashamed of me. I have been drinking heavily x 10 weeks. I drink about 12 cans of beer daily. I have been an alcoholic x 10 years. I have no support system. I feel very depressed and suicidal". Danielle Villanueva denies any intent and or plans to hurt self and or others.  Danielle Villanueva was admitted to the hospital ED with blood alcohol level of 238. And by the time she arrived at the [facility], her blood alcohol level was 76. She was intoxicated as well as suicidal due to her financial hardship. Danielle Villanueva required detoxification treatment as well as mood stabilization. She was initially started on the Librium detox protocols. And because her liver functions were already compromised by alcoholism per her elevated liver enzymes, Danielle Villanueva was switched from Librium detox protocols to Ativan detox treatment on a tapering dose format. She was also medicated with Wellbutrin XL 150 mg daily for depression, Remeron 15 mg Q bedtime for depression, insomnia and appetite stimulant. She also was ordered and received Neurontin 100 mg three times daily for substance withdrawal syndrome. She was enrolled in the group sessions, AA/NA meetings being offered and held on this unit. She learned coping skills. Danielle Villanueva was recommended for a residential treatment center as a part of her continuation therapy after discharge.  Danielle Villanueva has completed detox treatment and her mood stabilized. She is currently being discharged to continue substance abuse treatment at the [facility] treatment center in [place], [place]. And for medication management/routine psychiatric care, she will receive these serivices at the the [facility] here in [place], [place]. She has been provided with all the pertinent information required to make these appointments without problems. Danielle Villanueva received a 14 days  worth supply samples of her [facility] discharge medications. Upon discharge, she adamantly denies any SIHI, AVH, delusional thoughts, paranoia and or withdrawal symptoms. She left [facility] with all personal belongings in no distress. Transportation per taxi cab.  Consults:  psychiatry  Significant Diagnostic Studies:  labs: CBC with diff, CMP, UDS, toxicology tests, U/A  Discharge Vitals:   Blood pressure 134/85, pulse 99, temperature 99.1 F (37.3 C), temperature source Oral, resp. rate 18, height 5\' 1"  (1.549 m), weight 45.813 kg (101 lb). Body mass index is 19.09 kg/(m^2). Lab Results:   No results found for this or any previous visit (from the past 72 hour(s)).  Physical Findings: AIMS: Facial and Oral Movements Muscles of Facial Expression: None, normal Lips and Perioral Area: None, normal Jaw: None, normal Tongue: None, normal,Extremity Movements Upper (arms, wrists, hands, fingers): None, normal Lower (legs, knees, ankles, toes): None, normal, Trunk Movements Neck, shoulders, hips: None, normal, Overall Severity Severity of abnormal movements (highest score from questions above): None, normal Incapacitation due to abnormal movements: None, normal Patient's awareness of abnormal movements (rate only patient's report): No Awareness, Dental Status Current problems with teeth and/or dentures?: No Does patient usually wear dentures?: No  CIWA:  CIWA-Ar Total: 6 COWS:     Psychiatric Specialty Exam: See Psychiatric Specialty Exam and Suicide Risk  Assessment completed by Attending Physician prior to discharge.  Discharge destination:  [facility]  Is patient on multiple antipsychotic therapies at discharge:  No   Has Patient had three or more failed trials of antipsychotic monotherapy by history:  No  Recommended Plan for Multiple Antipsychotic Therapies: NA    Medication List       Indication   buPROPion 150 MG 24 hr tablet  Commonly known as:  WELLBUTRIN XL  Take 1  tablet (150 mg total) by mouth daily. For depression   Indication:  Major Depressive Disorder     gabapentin 100 MG capsule  Commonly known as:  NEURONTIN  Take 1 capsule (100 mg total) by mouth 3 (three) times daily. For alcohol withdrawal syndrome   Indication:  Alcohol Withdrawal Syndrome     ibuprofen 200 MG tablet  Commonly known as:  ADVIL,MOTRIN  Take 2 tablets (400 mg total) by mouth every 6 (six) hours as needed for moderate pain.   Indication:  Mild to Moderate Pain     mirtazapine 15 MG tablet  Commonly known as:  REMERON  Take 1 tablet (15 mg total) by mouth at bedtime. For depression/sleep   Indication:  Trouble Sleeping, Major Depressive Disorder       Follow-up Information   [facility]. (Call Monday to verify bed availability (per Trey PaulaJeff). If bed available, arrive on this date at 8am with SS card, ID (if [place] address), meds, and clothing. )    Contact information:   [address] [place], [place] [address] Phone: [phone] Fax: [phone]      Follow up with [facility]. (Walk in between 8am-9am Monday through Friday for hospital follow-up/medication management/assessment for therapy services. )    Contact information:   [address] [place], [place] [address] Phone: [phone] Fax: [phone]     Follow-up recommendations:  Activity:  As tolerated Diet: As recommended by your primary care doctor. Keep all scheduled follow-up appointments as recommended.  Comments: Take all your medications as prescribed by your mental healthcare provider. Report any adverse effects and or reactions from your medicines to your outpatient provider promptly. Patient is instructed and cautioned to not engage in alcohol and or illegal drug use while on prescription medicines. In the event of worsening symptoms, patient is instructed to call the crisis hotline, 911 and or go to the nearest ED for appropriate evaluation and  treatment of symptoms. Follow-up with your primary care provider for your other medical issues, concerns and or health care needs.   Total Discharge Time:  Greater than 30 minutes.  Signed: Sanjuana Kavawoko, Agnes I, PMHNP-BC 01/01/2014, 4:06 PM Personally evaluated the patient and agree with assessment and plan Madie RenoIrving A. Dub MikesLugo, M.D.

## 2014-01-01 NOTE — [facility] Suicide Risk Assessment (Signed)
Suicide Risk Assessment  Discharge Assessment     Demographic Factors:  Caucasian  Total Time spent with patient: 45 minutes  Psychiatric Specialty Exam:     Blood pressure 134/85, pulse 99, temperature 99.1 F (37.3 C), temperature source Oral, resp. rate 18, height 5\' 1"  (1.549 m), weight 45.813 kg (101 lb).Body mass index is 19.09 kg/(m^2).  General Appearance: Fairly Groomed  Patent attorneyye Contact::  Fair  Speech:  Clear and Coherent  Volume:  Normal  Mood:  worried  Affect:  Appropriate  Thought Process:  Coherent and Goal Directed  Orientation:  Full (Time, Place, and Person)  Thought Content:  relapse prevention plan  Suicidal Thoughts:  No  Homicidal Thoughts:  No  Memory:  Immediate;   Fair Recent;   Fair Remote;   Fair  Judgement:  Fair  Insight:  Present  Psychomotor Activity:  Normal  Concentration:  Fair  Recall:  [employer]is[employer]Fair  Fund of Knowledge:NA  Language: Fair  Akathisia:  No  Handed:    AIMS (if indicated):     Assets:  Desire for Improvement  Sleep:  Number of Hours: 4.75    Musculoskeletal: Strength & Muscle Tone: within normal limits Gait & Station: normal Patient leans: N/A   Mental Status Per Nursing Assessment::   On Admission:  Suicidal ideation indicated by patient;Self-harm thoughts;Belief that plan would result in death  Current Mental Status by Physician: In full contact with reality. There are no active S/S of withdrawal. Motivated to pursue the residential treatment program   Loss Factors: NA  Historical Factors: NA  Risk Reduction Factors:   wants to do better  Continued Clinical Symptoms:  Depression:   Comorbid alcohol abuse/dependence Alcohol/Substance Abuse/Dependencies  Cognitive Features That Contribute To Risk:  Closed-mindedness Polarized thinking Thought constriction (tunnel vision)    Suicide Risk:  Minimal: No identifiable suicidal ideation.  Patients presenting with no risk factors but with morbid ruminations; may be  classified as minimal risk based on the severity of the depressive symptoms  Discharge Diagnoses:   AXIS I:  Alcohol Dependence, GAD, Substance Induced Mood Disorder AXIS II:  No diagnosis AXIS III:   Past Medical History  Diagnosis Date  . Depression   . Anxiety    AXIS IV:  other psychosocial or environmental problems AXIS V:  61-70 mild symptoms  Plan Of Care/Follow-up recommendations:  Activity:  as tolerated Diet:  regular Follow up [facility] Is patient on multiple antipsychotic therapies at discharge:  No   Has Patient had three or more failed trials of antipsychotic monotherapy by history:  No  Recommended Plan for Multiple Antipsychotic Therapies: NA    Diania Co A 01/01/2014, 6:00 PM

## 2014-01-01 NOTE — Progress Notes (Signed)
Adult Psychoeducational Group Note  Date:  01/01/2014 Time:  2:07 PM  Group Topic/Focus:  Goals Group:   The focus of this group is to help patients establish daily goals to achieve during treatment and discuss how the patient can incorporate goal setting into their daily lives to aide in recovery.  Participation Level:  Active  Participation Quality:  Appropriate, Sharing and Supportive  Affect:  Appropriate  Cognitive:  Alert and Appropriate  Insight: Appropriate  Engagement in Group:  Engaged and Supportive  Modes of Intervention:  Support  Additional Comments:  Patient goal is to stay positive, get book published and to get into [facility] day program.  Juanda ChanceNowlin, Favor Hackler Jvette 01/01/2014, 2:07 PM

## 2014-01-01 NOTE — Progress Notes (Signed)
[facility] :  Will you be returning to the same living situation after discharge: No.  [facility] on 3/31 At discharge, do you have transportation home?:Yes,  taxi Do you have the ability to pay for your medications:Yes,  14 day supply given  Release of information consent forms completed and in the chart;  Patient's signature needed at discharge.  Patient to Follow up at: Follow-up Information   [facility]. (Call Monday to verify bed availability (per Trey PaulaJeff). If bed available, arrive on this date at 8am with SS card, ID (if [place] address), meds, and clothing. )    Contact information:   [address] [place], [place] [address] Phone: [phone] Fax: [phone]      Follow up with [facility]. (Walk in between 8am-9am Monday through Friday for hospital follow-up/medication management/assessment for therapy services. )    Contact information:   [address] [place], [place] [address] Phone: [phone] Fax: [phone]      Patient denies SI/HI:   Yes,  no reports    Safety Planning and Suicide Prevention discussed:  Yes,  see SI note  Danielle Villanueva [facility].  [facility] once [facility] has completed.   Danielle Villanueva, Danielle Villanueva 01/01/2014, 4:04 PM

## 2014-01-01 NOTE — Progress Notes (Signed)
[place] to DC early on Tuesday am (6:30am) to go [facility]. LCSW has filled out a taxi voucher, Health and safety inspectornotified RN and Charge along with MD of need for medications and risk assessment along with DC. [name] is also aware.  Voucher is on top of chart.   Ashley JacobsHannah Nail, MSW, LCSW Clinical Lead [phone]

## 2014-01-01 NOTE — Progress Notes (Signed)
[facility]  01/01/2014 5:44 PM Danielle Villanueva  MRN:  [id number] Subjective:  States that she is anticipating going to rehab in the morning. States she wants to go day by day as if she projects too far out she gets very anxious, worried. She welcomes the opportunity of being able to go to rehab and take the time to take care of herself Diagnosis:   DSM5: Schizophrenia Disorders:  none Obsessive-Compulsive Disorders:  none Trauma-Stressor Disorders:  none Substance/Addictive Disorders:  Alcohol Related Disorder - Severe (303.90) Depressive Disorders:  Major Depressive Disorder - Moderate (296.22) Total Time spent with patient: 30 minutes  Axis I: Substance Induced Mood Disorder  ADL's:  Intact  Sleep: Fair  Appetite:  Fair  Suicidal Ideation:  Plan:  denies Intent:  denies Means:  denies Homicidal Ideation:  Plan:  denies Intent:  denies Means:  denies AEB (as evidenced by):  Psychiatric Specialty Exam: Physical Exam  Review of Systems  Constitutional: Negative.   HENT: Negative.   Eyes: Negative.   Respiratory: Negative.   Cardiovascular: Negative.   Gastrointestinal: Negative.   Genitourinary: Negative.   Musculoskeletal: Negative.   Skin: Negative.   Neurological: Negative.   Endo/Heme/Allergies: Negative.   Psychiatric/Behavioral: Positive for substance abuse. The patient is nervous/anxious and has insomnia.     Blood pressure 134/85, pulse 99, temperature 99.1 F (37.3 C), temperature source Oral, resp. rate 18, height 5\' 1"  (1.549 m), weight 45.813 kg (101 lb).Body mass index is 19.09 kg/(m^2).  General Appearance: Fairly Groomed  Patent attorneyye Contact::  Fair  Speech:  Clear and Coherent  Volume:  Normal  Mood:  Anxious and worried  Affect:  worried  Thought Process:  Coherent and Goal Directed  Orientation:  Full (Time, Place, and Person)  Thought Content:  worries, concerns  Suicidal Thoughts:  No  Homicidal Thoughts:  No  Memory:  Immediate;    Fair Recent;   Fair Remote;   Fair  Judgement:  Fair  Insight:  Present  Psychomotor Activity:  Restlessness  Concentration:  Fair  Recall:  FiservFair  Fund of Knowledge:NA  Language: Fair  Akathisia:  No  Handed:    AIMS (if indicated):     Assets:  Desire for Improvement  Sleep:  Number of Hours: 4.75   Musculoskeletal: Strength & Muscle Tone: within normal limits Gait & Station: normal Patient leans: N/A  Current Medications: Current Facility-Administered Medications  Medication Dose Route Frequency Provider Last Rate Last Dose  . alum & mag hydroxide-simeth (MAALOX/MYLANTA) 200-200-20 MG/5ML suspension 30 mL  30 mL Oral Q4H PRN Kerry HoughSpencer E Simon, PA-C      . buPROPion (WELLBUTRIN XL) 24 hr tablet 150 mg  150 mg Oral Daily Nanine MeansJamison Lord, NP   150 mg at 01/01/14 [id number]  . gabapentin (NEURONTIN) capsule 100 mg  100 mg Oral TID Rachael FeeIrving A Jerod Mcquain, MD   100 mg at 01/01/14 1714  . ibuprofen (ADVIL,MOTRIN) tablet 400 mg  400 mg Oral Q6H PRN Rachael FeeIrving A Amaziah Ghosh, MD   400 mg at 01/01/14 [id number]  . lidocaine (LIDODERM) 5 % 1 patch  1 patch Transdermal Q24H Rachael FeeIrving A Eliz Nigg, MD   1 patch at 01/01/14 1005  . LORazepam (ATIVAN) tablet 1 mg  1 mg Oral Q6H PRN Nanine MeansJamison Lord, NP   1 mg at 12/31/13 2110  . magnesium hydroxide (MILK OF MAGNESIA) suspension 30 mL  30 mL Oral Daily PRN Kerry HoughSpencer E Simon, PA-C      . methocarbamol (ROBAXIN) tablet 500  mg  500 mg Oral Q8H PRN Sanjuana Kava, NP   500 mg at 12/30/13 1717  . mirtazapine (REMERON) tablet 15 mg  15 mg Oral QHS Sanjuana Kava, NP   15 mg at 12/31/13 2111  . multivitamin with minerals tablet 1 tablet  1 tablet Oral Daily Kerry Hough, PA-C   1 tablet at 01/01/14 1610  . nicotine (NICODERM CQ - dosed in mg/24 hours) patch 21 mg  21 mg Transdermal Q0600 Rachael Fee, MD   21 mg at 01/01/14 9604  . thiamine (B-1) injection 100 mg  100 mg Intramuscular Once [employer], PA-C      . thiamine (VITAMIN B-1) tablet 100 mg  100 mg Oral Daily Kerry Hough, PA-C   100  mg at 01/01/14 5409    Lab Results: No results found for this or any previous visit (from the past 48 hour(s)).  Physical Findings: AIMS: Facial and Oral Movements Muscles of Facial Expression: None, normal Lips and Perioral Area: None, normal Jaw: None, normal Tongue: None, normal,Extremity Movements Upper (arms, wrists, hands, fingers): None, normal Lower (legs, knees, ankles, toes): None, normal, Trunk Movements Neck, shoulders, hips: None, normal, Overall Severity Severity of abnormal movements (highest score from questions above): None, normal Incapacitation due to abnormal movements: None, normal Patient's awareness of abnormal movements (rate only patient's report): No Awareness, Dental Status Current problems with teeth and/or dentures?: No Does patient usually wear dentures?: No  CIWA:  CIWA-Ar Total: 6 COWS:     Treatment Plan Summary: Daily contact with patient to assess and evaluate symptoms and progress in treatment Medication management  Plan: Supportive approach/coping skills/relapse prevention           Facilitate admission to [facility] in the AM  Medical Decision Making Problem Points:  Review of psycho-social stressors (1) Data Points:  Review of medication regiment & side effects (2)  I certify that inpatient services furnished can reasonably be expected to improve the patient's condition.   Shriley Joffe A 01/01/2014, 5:44 PM

## 2014-01-01 NOTE — Progress Notes (Signed)
Patient ID: Danielle Villanueva, female   DOB: Sep 17, 1951, 63 y.o.   MRN: [id number] She has been up and interacting with peers and staff and attending staff.  Depression 4, hopelessness 4 withdrawals of craving tremors has not requested any prn medications.

## 2014-01-01 NOTE — Tx Team (Signed)
Date:01/01/2014  Progress in Treatment:  Attending groups: Yes  Participating in groups: Yes  Taking medication as prescribed: Yes  Tolerating medication: Yes  Family/Significant othe contact made: yes spoke with patient son Patient understands diagnosis: Yes, AEB requesting detox and long term treatment at [facility] Discussing patient identified problems/goals with staff: Yes  Medical problems stabilized or resolved: Yes  Denies suicidal/homicidal ideation: Yes Patient has not harmed self or Others: Yes   New problem(s) identified: none  Discharge Plan or Barriers:  [facility] Has bed for 3/31  Additional comments: n/a   Reason for Continuation of Hospitalization:  Medication stabilization    Estimated length of stay: [facility]     For review of initial/current patient goals, please see plan of care.  Attendees:  Attendees:  Patient:    Family:    Physician: Dr.Lugo MD  01/01/2014 11:31 AM  Nursing: Onnie BoerJennifer Clark, RN Case manager     Clinical Social Worker Mordecai RasmussenHannah Kyrian Stage, LCSW, MSW    Other: Aggie NP    Other:    Other: Lupita Leashonna, RN    Other:      Mordecai RasmussenHannah Sufyan Meidinger, LCSW, MSW   01/01/2014

## 2014-01-01 NOTE — [facility] Group Notes (Signed)
[facility]   01/01/2014 10:08 AM  Participation Quality:  Active  Mood/Affect:  Appropriate  "I have hope today)  Depression Rating:  3  Anxiety Rating:  3  Thoughts of Suicide:  No Will you contract for safety?   Yes  Current AVH:  No  Plan for Discharge/Comments:   Danielle Villanueva is agreeable to [facility]. Reports she wants an update to make sure that is her plan.  She has a bed currently for tomorrow Tuesday 3/31.    Transportation Means: Taxi or [facility] picking her up  Supports: 1 friend she reports.  Raye Sorrowoble, Makenna Macaluso N

## 2014-01-01 NOTE — [facility] Group Notes (Signed)
[facility] LCSW Group Therapy  01/01/2014 3:16 PM  Type of Therapy:  Group Therapy  Participation Level:  Active  Participation Quality:  Attentive, Sharing and Supportive  Affect:  Appropriate  Cognitive:  Alert and Oriented  Insight:  Developing/Improving  Engagement in Therapy:  Improving  Modes of Intervention:  Discussion, Exploration and Problem-solving  Summary of Progress/Problems:  In this group patients will be encouraged to explore what they see as obstacles to their own wellness and recovery. They will be guided to discuss their thoughts, feelings, and behaviors related to these obstacles. The group will process together ways to cope with barriers, with attention given to specific choices patients can make. Each patient will be challenged to identify changes they are motivated to make in order to overcome their obstacles.  [name] was attentive and supportive of others in group setting. She reports her obstacles are housing, finances and not finding a job. She shares she becomes emotionally apathetic when she applies for jobs and is not compensated for her work appropriately.  [name] is able to provide meaningful discussion and engaged conversation of overcoming obstacles. Raye SorrowCoble, Sirenia Whitis N 01/01/2014, 3:16 PM

## 2014-01-02 NOTE — Progress Notes (Signed)
Patient ID: Danielle Villanueva, female   DOB: 1950/11/04, 63 y.o.   MRN: [id number]  Pt appeared to be slightly irritated with the writer. Writer noticed that pt had several spoons in packs, straws, several bottles of shampoo/body wash. Informed the pt that it was excessive and shouldn't have been stored. Once in the search room pt took the straws and spoons and tossed them on the table. During the rest of the discharge pt was blunt with the writer, even stating that "she didn't need anything from her", referring to the writer. Writer asked pt if she understood the discharge instructions and pt stated she didn't need anything from the writer. However, pt did ask the writer to weigh her before she left. Pt was also upset that she didn't have samples of nicotine patches along with her other meds.  Pt denied SI, HI, A/V

## 2014-01-04 NOTE — Progress Notes (Signed)
Patient Discharge Instructions:  After Visit Summary (AVS):   Faxed to:  01/04/14 Discharge Summary Note:   Faxed to:  01/04/14 Psychiatric Admission Assessment Note:   Faxed to:  01/04/14 Suicide Risk Assessment - Discharge Assessment:   Faxed to:  01/04/14 Faxed/Sent to the Next Level Care provider:  01/04/14 [facility] @ [phone] [facility] @ [phone]  Jerelene ReddenSheena E [place], 01/04/2014, 2:18 PM

## 2014-01-14 ENCOUNTER — Emergency Department (HOSPITAL_COMMUNITY)
Admission: EM | Admit: 2014-01-14 | Discharge: 2014-01-15 | Disposition: A | Discharge status: Other Acute Inpatient Hospital | Payer: Federal, State, Local not specified - Other | Attending: Emergency Medicine | Admitting: Emergency Medicine

## 2014-01-14 DIAGNOSIS — Z79899 Other long term (current) drug therapy: Secondary | ICD-10-CM | POA: Insufficient documentation

## 2014-01-14 DIAGNOSIS — F329 Major depressive disorder, single episode, unspecified: Secondary | ICD-10-CM | POA: Insufficient documentation

## 2014-01-14 DIAGNOSIS — R45851 Suicidal ideations: Secondary | ICD-10-CM | POA: Insufficient documentation

## 2014-01-14 DIAGNOSIS — F1010 Alcohol abuse, uncomplicated: Secondary | ICD-10-CM

## 2014-01-14 DIAGNOSIS — F17200 Nicotine dependence, unspecified, uncomplicated: Secondary | ICD-10-CM | POA: Insufficient documentation

## 2014-01-14 DIAGNOSIS — F3289 Other specified depressive episodes: Secondary | ICD-10-CM | POA: Insufficient documentation

## 2014-01-14 DIAGNOSIS — F1094 Alcohol use, unspecified with alcohol-induced mood disorder: Secondary | ICD-10-CM

## 2014-01-14 DIAGNOSIS — F1020 Alcohol dependence, uncomplicated: Secondary | ICD-10-CM | POA: Insufficient documentation

## 2014-01-14 DIAGNOSIS — F10988 Alcohol use, unspecified with other alcohol-induced disorder: Secondary | ICD-10-CM | POA: Insufficient documentation

## 2014-01-14 DIAGNOSIS — F411 Generalized anxiety disorder: Secondary | ICD-10-CM | POA: Insufficient documentation

## 2014-01-14 DIAGNOSIS — Z0289 Encounter for other administrative examinations: Secondary | ICD-10-CM | POA: Insufficient documentation

## 2014-01-14 LAB — CBC
HEMATOCRIT: 40.7 % (ref 36.0–46.0)
HEMOGLOBIN: 13.7 g/dL (ref 12.0–15.0)
MCH: 31.6 pg (ref 26.0–34.0)
MCHC: 33.7 g/dL (ref 30.0–36.0)
MCV: 94 fL (ref 78.0–100.0)
Platelets: 436 10*3/uL — ABNORMAL HIGH (ref 150–400)
RBC: 4.33 MIL/uL (ref 3.87–5.11)
RDW: 14.8 % (ref 11.5–15.5)
WBC: 8.8 10*3/uL (ref 4.0–10.5)

## 2014-01-14 LAB — SALICYLATE LEVEL: Salicylate Lvl: <2 mg/dL — ABNORMAL LOW (ref 2.8–20.0)

## 2014-01-14 LAB — ACETAMINOPHEN LEVEL: Acetaminophen (Tylenol), Serum: <15 ug/mL (ref 10–30)

## 2014-01-14 LAB — RAPID URINE DRUG SCREEN, HOSP PERFORMED
Amphetamines: NOT DETECTED
BARBITURATES: NOT DETECTED
BENZODIAZEPINES: NOT DETECTED
Cocaine: NOT DETECTED
Opiates: NOT DETECTED
Tetrahydrocannabinol: NOT DETECTED

## 2014-01-14 LAB — COMPREHENSIVE METABOLIC PANEL
ALK PHOS: 94 U/L (ref 39–117)
ALT: 22 U/L (ref 0–35)
AST: 26 U/L (ref 0–37)
Albumin: 3.9 g/dL (ref 3.5–5.2)
BUN: 8 mg/dL (ref 6–23)
CHLORIDE: 101 meq/L (ref 96–112)
CO2: 21 mEq/L (ref 19–32)
Calcium: 9.5 mg/dL (ref 8.4–10.5)
Creatinine, Ser: 0.55 mg/dL (ref 0.50–1.10)
GFR calc non Af Amer: >90 mL/min (ref >90)
Glucose, Bld: 92 mg/dL (ref 70–99)
Potassium: 4.2 mEq/L (ref 3.7–5.3)
Sodium: 139 mEq/L (ref 137–147)
Total Protein: 7.2 g/dL (ref 6.0–8.3)

## 2014-01-14 LAB — ETHANOL: ALCOHOL ETHYL (B): 224 mg/dL — AB (ref 0–11)

## 2014-01-14 MED ORDER — NICOTINE 21 MG/24HR TD PT24
21.0000 mg | MEDICATED_PATCH | Freq: Every day | TRANSDERMAL | Status: DC
  Administered 2014-01-14 – 2014-01-15 (×2): 21 mg via TRANSDERMAL
  Filled 2014-01-14 (×2): qty 1

## 2014-01-14 MED ORDER — MIRTAZAPINE 30 MG PO TABS
15.0000 mg | ORAL_TABLET | Freq: Every day | ORAL | Status: DC
  Administered 2014-01-14: 15 mg via ORAL
  Filled 2014-01-14 (×2): qty 1

## 2014-01-14 MED ORDER — BUPROPION HCL ER (XL) 150 MG PO TB24
150.0000 mg | ORAL_TABLET | Freq: Every day | ORAL | Status: DC
  Administered 2014-01-14 – 2014-01-15 (×2): 150 mg via ORAL
  Filled 2014-01-14 (×2): qty 1

## 2014-01-14 MED ORDER — GABAPENTIN 100 MG PO CAPS
100.0000 mg | ORAL_CAPSULE | Freq: Three times a day (TID) | ORAL | Status: DC
  Administered 2014-01-14 – 2014-01-15 (×2): 100 mg via ORAL
  Filled 2014-01-14 (×4): qty 1

## 2014-01-14 MED ORDER — LORAZEPAM 1 MG PO TABS
1.0000 mg | ORAL_TABLET | Freq: Once | ORAL | Status: AC
  Administered 2014-01-14: 1 mg via ORAL
  Filled 2014-01-14: qty 1

## 2014-01-14 MED ORDER — TRAZODONE HCL 50 MG PO TABS
50.0000 mg | ORAL_TABLET | Freq: Every evening | ORAL | Status: DC | PRN
  Administered 2014-01-14: 50 mg via ORAL
  Filled 2014-01-14 (×2): qty 1

## 2014-01-14 NOTE — BH Assessment (Addendum)
Assessment Note  Danielle Villanueva is an 63 y.o. female. [facility], who report she called 911 stating she was suicidal and planning [place] [address].  [place].  Pt has, per RN staff, been very irritable [facility].  Pt completed about [facility] of the initial [facility] and then told ACT team that she wanted to watch her movie and would not answer any further questions.  Any blank questions on the assessment were not answered due to pt refusing to continue.  Pt reports [facility] [place] where she [facility]10.  [facility]  Pt reports [facility] "kicked me out" prior to her being able to arrange further treatment and reports she has been drinking the past 3 days at the motel.  Pt reports conflict with family, who are refusing to help her and states she has furniture in storage that is going to be auctioned off due to her not having storage fee and her family refuses to assist her.  Pt acknowledges SI but refused to give any details regarding plan or intent.  Pt denies HI/AV.  [facility] from [facility].  Axis I: Substance Abuse Axis II: Deferred Axis III:  Past Medical History  Diagnosis Date  . Depression   . Anxiety    Axis IV: economic problems, housing problems and problems with primary support group Axis V: 31-40 impairment in reality testing  Past Medical History:  Past Medical History  Diagnosis Date  . Depression   . Anxiety     No past surgical history on file.  Family History: No family history on file.  Social History:  reports that she has been smoking Cigarettes.  She has been smoking about 1.50 packs per day. She does not have any smokeless tobacco history on file. She reports that she drinks alcohol. She reports that she does not use illicit drugs.  Additional Social History:  Alcohol / Drug Use Pain Medications: pt denies Prescriptions: pt  denies Over the Counter: pt denies History of alcohol / drug use?: Yes Substance #1 Name of Substance 1: alcohol 1 - Age of First Use: 24 1 - Amount (size/oz): 5 beers 1 - Frequency: daily 1 - Duration: 28 years of daily alchol use 1 - Last Use / Amount: 4/12 1 bottle of wine, 2-3 beers  CIWA: CIWA-Ar BP: 110/58 mmHg Pulse Rate: 93 Nausea and Vomiting: no nausea and no vomiting Tactile Disturbances: none Tremor: no tremor Auditory Disturbances: not present Paroxysmal Sweats: no sweat visible Visual Disturbances: not present Anxiety: moderately anxious, or guarded, so anxiety is inferred Headache, Fullness in Head: moderately severe Agitation: somewhat more than normal activity Orientation and Clouding of Sensorium: oriented and can do serial additions CIWA-Ar Total: 9 COWS:    Allergies:  Allergies  Allergen Reactions  . Asa [Aspirin] Nausea And Vomiting    Home Medications:  (Not in a hospital admission)  OB/GYN Status:  No LMP recorded. Patient is postmenopausal.  General Assessment Data [facility] of Assessment: [facility] ACT Assessment: Yes Is this a Tele or Face-to-Face Assessment?: Face-to-Face Is this an Initial Assessment or a Re-assessment for this encounter?: Initial Assessment Living Arrangements: Alone Can pt return to current living arrangement?: No (PT said she cannot return to [address], lived there since Jan) Admission Status: Involuntary     [facility]  Plan Living Arrangements: Alone     Risk to self Suicidal Ideation: Yes-Currently Present Suicidal Intent:  (unknown) Is patient at risk for suicide?: Yes Suicidal Plan?:  (pt would not say) Access to Means:  (unknown) What has been your use of drugs/alcohol within the last 12 months?: current alcohol use Previous Attempts/Gestures:  (unknown) Intentional Self Injurious Behavior:  (pt would not say) Persecutory voices/beliefs?: No Depression: Yes Depression Symptoms: Despondent Substance  abuse history and/or treatment for substance abuse?: Yes Suicide prevention information given to non-admitted patients: Not applicable  Risk to Others Homicidal Ideation: No Thoughts of Harm to Others: No Current Homicidal Intent: No Current Homicidal Plan: No Access to Homicidal Means: No History of harm to others?:  (unknown) Does patient have access to weapons?:  (unknown) Criminal Charges Pending?:  (unknown)  Psychosis Hallucinations: None noted Delusions: None noted  Mental Status Report Appear/Hygiene: Disheveled Eye Contact: Fair Motor Activity: Restlessness Speech: Logical/coherent Level of Consciousness: Alert Mood: Irritable Affect: Angry Anxiety Level: Minimal Thought Processes: Coherent;Relevant Judgement: Unimpaired Orientation: Person;Place;Time;Situation Obsessive Compulsive Thoughts/Behaviors: None  Cognitive Functioning Concentration: Normal Memory: Recent Intact;Remote Intact IQ: Average Insight: Good Impulse Control: Poor Appetite: Fair Weight Loss: 0 Weight Gain: 4 Sleep: No Change Total Hours of Sleep: 8 Vegetative Symptoms: None  ADLScreening [facility] Assessment Services) Patient's cognitive ability adequate to safely complete daily activities?: Yes Patient able to express need for assistance with ADLs?: Yes Independently performs ADLs?: Yes (appropriate for developmental age)  Prior Inpatient Therapy Prior Inpatient Therapy: Yes ([facility], [place].  discharged 01/12/14) Prior Therapy Dates: 12/2013 Prior Therapy Facilty/Provider(s): [facility] Reason for Treatment: detox  Prior Outpatient Therapy Prior Outpatient Therapy: No  ADL Screening (condition at time of admission) Patient's cognitive ability adequate to safely complete daily activities?: Yes Patient able to express need for assistance with ADLs?: Yes Independently performs ADLs?: Yes (appropriate for developmental age)         Values / Beliefs Cultural Requests During Hospitalization:  None Spiritual Requests During Hospitalization: None        Additional Information 1:1 In Past 12 Months?: No CIRT Risk: No Elopement Risk: No Does patient have medical clearance?: Yes     Disposition: I discussed this pt with Dr Thurnell Lose of [facility], who agrees with plan that pt be hospitalized for psychiatric stabilization.  Discussed with Alberteen Sam of [facility] who agrees pt meets inpt criteria.  [facility] at [facility], TTS will look for placement elsewhere. Disposition Initial Assessment Completed for this Encounter: Yes Disposition of Patient: Inpatient treatment program;Referred to Type of inpatient treatment program: Adult  On Site Evaluation by:   Reviewed with Physician:    Clementeen Hoof [facility] 01/14/2014 10:15 PM

## 2014-01-14 NOTE — ED Notes (Addendum)
Pt ambulatory to room 36 from triage w/ [employer]

## 2014-01-14 NOTE — Progress Notes (Signed)
IVC paperwork approved by Magistrate [facility]. Per Lung, police has been dispatched.  Danielle LasterAlexandra Kadir Villanueva LCSWA,     ED CSW  phone: [phone] 5:07pm

## 2014-01-14 NOTE — ED Notes (Signed)
Up to the desk on the phone 

## 2014-01-14 NOTE — ED Notes (Addendum)
[facility] EMS [facility] [place] and called stating that she was suicidal and was going to jump [place] [address]. [facility] that [facility] for [facility] problems and has been drinking ever since. Pt did not want to give her name but finally did upon arrival to ED. Non-cooperative with staff but not aggressive. Alert and oriented.

## 2014-01-14 NOTE — ED Provider Notes (Signed)
CSN: [id number]     Arrival date & time 01/14/14  1533 History   First MD Initiated Contact with Patient 01/14/14 1556     Chief Complaint  Patient presents with  . Medical Clearance  . Alcohol Intoxication      HPI Per EMS [facility] [place] and called stating that she was suicidal and was going to jump [place] [address]. [facility] that [facility] for alcohol problems and has been drinking ever since. Pt did not want to give her name but finally did upon arrival to ED. Non-cooperative with staff but not aggressive. Alert and oriented  Past Medical History  Diagnosis Date  . Depression   . Anxiety    No past surgical history on file. No family history on file. History  Substance Use Topics  . Smoking status: Current Every Day Smoker -- 1.50 packs/day    Types: Cigarettes  . Smokeless tobacco: Not on file  . Alcohol Use: Yes     Comment: 12 Beers, Daily    OB History   Grav Para Term Preterm Abortions TAB SAB Ect Mult Living                 Review of Systems  Unable to perform ROS: Psychiatric disorder      Allergies  Asa  Home Medications   No current outpatient prescriptions on file. BP 106/68  Pulse 84  Temp(Src) 98.8 F (37.1 C) (Oral)  Resp 18  SpO2 99% Physical Exam  Nursing note and vitals reviewed. Constitutional: She is oriented to person, place, and time. She appears well-developed and well-nourished. No distress.  HENT:  Head: Normocephalic and atraumatic.  Eyes: Pupils are equal, round, and reactive to light.  Neck: Normal range of motion.  Cardiovascular: Normal rate and intact distal pulses.   Pulmonary/Chest: No respiratory distress.  Abdominal: Normal appearance. She exhibits no distension.  Musculoskeletal: Normal range of motion.  Neurological: She is alert and oriented to person, place, and time. No cranial nerve deficit.  Skin: Skin is warm and dry. No rash noted.  Psychiatric: She has a  normal mood and affect. Her speech is normal and behavior is normal. She expresses suicidal (dynamic) ideation. She expresses suicidal plans.    ED Course  Procedures (including critical care time) Labs Review Labs Reviewed  CBC - Abnormal; Notable for the following:    Platelets 436 (*)    All other components within normal limits  COMPREHENSIVE METABOLIC PANEL - Abnormal; Notable for the following:    Total Bilirubin <0.2 (*)    All other components within normal limits  ETHANOL - Abnormal; Notable for the following:    Alcohol, Ethyl (B) 224 (*)    All other components within normal limits  SALICYLATE LEVEL - Abnormal; Notable for the following:    Salicylate Lvl <2.0 (*)    All other components within normal limits  ACETAMINOPHEN LEVEL  URINE RAPID DRUG SCREEN (HOSP PERFORMED)   Imaging Review No results found.   EKG Interpretation None       Involuntary commitment papers were taken. MDM   Final diagnoses:  Alcohol dependence  Alcohol-induced mood disorder  Alcohol abuse       Nelia Shiobert L Mikiala Fugett, MD 01/18/14 [phone]

## 2014-01-14 NOTE — ED Notes (Signed)
Up at the desk on the phone 

## 2014-01-14 NOTE — ED Notes (Signed)
Pt has been wanded by security. 

## 2014-01-14 NOTE — ED Notes (Signed)
[facility]

## 2014-01-15 ENCOUNTER — Inpatient Hospital Stay (HOSPITAL_COMMUNITY)
Admission: AD | Admit: 2014-01-15 | Discharge: 2014-01-25 | DRG: 897 | Disposition: A | Discharge status: Rehabilitation Facility | Payer: Federal, State, Local not specified - Other | Source: Intra-hospital | Attending: Psychiatry | Admitting: Psychiatry

## 2014-01-15 ENCOUNTER — Encounter (HOSPITAL_COMMUNITY): Payer: Self-pay | Admitting: Intensive Care

## 2014-01-15 DIAGNOSIS — F332 Major depressive disorder, recurrent severe without psychotic features: Secondary | ICD-10-CM | POA: Diagnosis present

## 2014-01-15 DIAGNOSIS — F1994 Other psychoactive substance use, unspecified with psychoactive substance-induced mood disorder: Secondary | ICD-10-CM | POA: Diagnosis present

## 2014-01-15 DIAGNOSIS — F10988 Alcohol use, unspecified with other alcohol-induced disorder: Secondary | ICD-10-CM

## 2014-01-15 DIAGNOSIS — Z598 Other problems related to housing and economic circumstances: Secondary | ICD-10-CM

## 2014-01-15 DIAGNOSIS — F1010 Alcohol abuse, uncomplicated: Secondary | ICD-10-CM

## 2014-01-15 DIAGNOSIS — F1020 Alcohol dependence, uncomplicated: Principal | ICD-10-CM | POA: Diagnosis present

## 2014-01-15 DIAGNOSIS — F411 Generalized anxiety disorder: Secondary | ICD-10-CM | POA: Diagnosis present

## 2014-01-15 DIAGNOSIS — Z5987 Material hardship due to limited financial resources, not elsewhere classified: Secondary | ICD-10-CM

## 2014-01-15 DIAGNOSIS — R45851 Suicidal ideations: Secondary | ICD-10-CM

## 2014-01-15 DIAGNOSIS — F1094 Alcohol use, unspecified with alcohol-induced mood disorder: Secondary | ICD-10-CM

## 2014-01-15 DIAGNOSIS — F17200 Nicotine dependence, unspecified, uncomplicated: Secondary | ICD-10-CM | POA: Diagnosis present

## 2014-01-15 DIAGNOSIS — G4700 Insomnia, unspecified: Secondary | ICD-10-CM | POA: Diagnosis present

## 2014-01-15 MED ORDER — ALUM & MAG HYDROXIDE-SIMETH 200-200-20 MG/5ML PO SUSP: 30.0000 mL | ORAL | Status: DC | PRN

## 2014-01-15 MED ORDER — GABAPENTIN 100 MG PO CAPS
100.0000 mg | ORAL_CAPSULE | Freq: Three times a day (TID) | ORAL | Status: DC
  Administered 2014-01-15 – 2014-01-25 (×30): 100 mg via ORAL
  Filled 2014-01-15 (×12): qty 1
  Filled 2014-01-15: qty 42
  Filled 2014-01-15 (×9): qty 1
  Filled 2014-01-15: qty 42
  Filled 2014-01-15 (×4): qty 1
  Filled 2014-01-15: qty 42
  Filled 2014-01-15 (×12): qty 1

## 2014-01-15 MED ORDER — BUPROPION HCL ER (XL) 150 MG PO TB24
150.0000 mg | ORAL_TABLET | Freq: Every day | ORAL | Status: DC
  Administered 2014-01-16 – 2014-01-25 (×10): 150 mg via ORAL
  Filled 2014-01-15 (×13): qty 1
  Filled 2014-01-15: qty 14

## 2014-01-15 MED ORDER — LORAZEPAM 1 MG PO TABS: 1.0000 mg | ORAL_TABLET | Freq: Four times a day (QID) | ORAL | Status: AC | PRN

## 2014-01-15 MED ORDER — MIRTAZAPINE 15 MG PO TABS
15.0000 mg | ORAL_TABLET | Freq: Every day | ORAL | Status: DC
  Administered 2014-01-16 – 2014-01-24 (×6): 15 mg via ORAL
  Filled 2014-01-15 (×3): qty 1
  Filled 2014-01-15: qty 14
  Filled 2014-01-15 (×10): qty 1

## 2014-01-15 MED ORDER — LORAZEPAM 1 MG PO TABS
1.0000 mg | ORAL_TABLET | Freq: Once | ORAL | Status: AC
  Administered 2014-01-15: 1 mg via ORAL
  Filled 2014-01-15: qty 1

## 2014-01-15 MED ORDER — MAGNESIUM HYDROXIDE 400 MG/5ML PO SUSP: 30.0000 mL | Freq: Every day | ORAL | Status: DC | PRN

## 2014-01-15 MED ORDER — LORAZEPAM 1 MG PO TABS: 2.0000 mg | ORAL_TABLET | ORAL | Status: DC | PRN

## 2014-01-15 MED ORDER — NICOTINE 21 MG/24HR TD PT24
21.0000 mg | MEDICATED_PATCH | Freq: Every day | TRANSDERMAL | Status: DC
  Administered 2014-01-16 – 2014-01-25 (×10): 21 mg via TRANSDERMAL
  Filled 2014-01-15 (×13): qty 1

## 2014-01-15 MED ORDER — ONDANSETRON 4 MG PO TBDP: 4.0000 mg | ORAL_TABLET | Freq: Three times a day (TID) | ORAL | Status: DC | PRN

## 2014-01-15 MED ORDER — LORAZEPAM 1 MG PO TABS
2.0000 mg | ORAL_TABLET | ORAL | Status: AC | PRN
  Administered 2014-01-15 (×2): 2 mg via ORAL
  Filled 2014-01-15 (×2): qty 2

## 2014-01-15 MED ORDER — LORAZEPAM 1 MG PO TABS: 1.0000 mg | ORAL_TABLET | ORAL | Status: DC | PRN

## 2014-01-15 MED ORDER — LORAZEPAM 1 MG PO TABS: 1.0000 mg | ORAL_TABLET | Freq: Four times a day (QID) | ORAL | Status: DC | PRN

## 2014-01-15 MED ORDER — ACETAMINOPHEN 325 MG PO TABS
650.0000 mg | ORAL_TABLET | ORAL | Status: DC | PRN
  Administered 2014-01-15: 650 mg via ORAL
  Filled 2014-01-15: qty 2

## 2014-01-15 MED ORDER — LORAZEPAM 1 MG PO TABS
1.0000 mg | ORAL_TABLET | ORAL | Status: AC | PRN
  Administered 2014-01-16: 1 mg via ORAL
  Filled 2014-01-15: qty 1

## 2014-01-15 MED ORDER — TRAZODONE HCL 50 MG PO TABS
50.0000 mg | ORAL_TABLET | Freq: Every evening | ORAL | Status: DC | PRN
  Administered 2014-01-23: 50 mg via ORAL
  Filled 2014-01-15: qty 14
  Filled 2014-01-15: qty 1

## 2014-01-15 NOTE — Progress Notes (Signed)
[facility] OBS ADMISSION NOTE  01/15/2014 6:24 PM Danielle Villanueva  MRN:  [id number] Subjective: During initial assessment, pt rates anxiety and depression at a maximum. Pt denies HI and AVH, reports intermittent SI with plan to jump in front of a car. Pt reports that these thoughts are intermittent and is willing to contract for safety while inpatient. Pt reports that her main stressors are housing, lack of job, and wanting to stop drinking. Pt is interested in detox treatment and seeking inpatient detoxification. Pt is currently on the Ativan protocol for withdrawal symptoms.     HPI: Danielle Villanueva is an 63 y.o. female. Pt brought to [facility] by EMS, who report she called 911 stating she was suicidal and planning [place] [address]. Pt [place] in [place]. Pt has, per RN staff, been very irritable since arriving at [facility]. Pt completed about half of the initial [facility] assessment and then told ACT team that she wanted to watch her movie and would not answer any further questions. Any blank questions on the assessment were not answered due to pt refusing to continue. Pt reports she was detoxed at [facility] 12/2013 and then transferred to [facility], [place], where she was discharged on 4/10. [facility] discharge 3/31) Pt reports [facility] "kicked me out" prior to her being able to arrange further treatment and reports she has been drinking the past 3 days at the motel. Pt reports conflict with family. IVC by cousin was in [address]. Has been a recent discharge from [facility] facility and was hospitalized end march at [facility] for detox. Still started drinking again. Social services has called cousin who mentioned IVC was done because of that time concern of suicide. She has a repetitive pattern of suicidal threats and visit to ED. Says its difficult to stop drinking i dont have a job. Doc I need a job would do ok.   Diagnosis:   DSM5:  Substance/Addictive Disorders:  Alcohol Intoxication with Use Disorder - Severe  (F10.229) Depressive Disorders:  Major Depressive Disorder - Severe (296.23) Total Time spent with patient: Greater than 30 minutes  Axis I: Alcohol Abuse, Major Depression, Recurrent severe and Substance Induced Mood Disorder Axis II: Deferred Axis III:  Past Medical History  Diagnosis Date  . Depression   . Anxiety    Axis IV: other psychosocial or environmental problems and problems related to social environment Axis V: 41-50 serious symptoms  ADL's:  Intact  Sleep: Fair  Appetite:  Good  Suicidal Ideation:  SI with plan to jump in front of car; contracts for safety. Homicidal Ideation:  Denies AEB (as evidenced by):  Psychiatric Specialty Exam: Physical Exam Full Physical Exam performed in ED; reviewed, stable, and I concur with this assessment.   Review of Systems  Constitutional: Negative.   HENT: Negative.   Eyes: Negative.   Respiratory: Negative.   Cardiovascular: Negative.   Gastrointestinal: Negative.   Genitourinary: Negative.   Musculoskeletal: Negative.   Skin: Negative.   Neurological: Negative.   Endo/Heme/Allergies: Negative.   Psychiatric/Behavioral: Positive for depression. The patient is nervous/anxious.     Blood pressure 104/68, pulse 82, temperature 98.8 F (37.1 C), temperature source Oral, resp. rate 18, height $RemoveBe'5\' 1"'ytOumdgVm$  (1.549 m), weight 48.081 kg (106 lb), SpO2 98.00%.Body mass index is 20.04 kg/(m^2).  General Appearance: Disheveled  Eye Sport and exercise psychologist::  Fair  Speech:  Clear and Coherent  Volume:  Normal  Mood:  Anxious and Depressed  Affect:  Appropriate and Depressed  Thought Process:  Coherent  Orientation:  Full (Time, Place, and Person)  Thought Content:  WDL  Suicidal Thoughts:  Yes.  with intent/plan  Homicidal Thoughts:  No  Memory:  Immediate;   Fair Recent;   Fair Remote;   Fair  Judgement:  Fair  Insight:  Fair  Psychomotor Activity:  Decreased  Concentration:  Good  Recall:  [facility]: Fair   Akathisia:  NA  Handed:  Right  AIMS (if indicated):     Assets:  Desire for Improvement Resilience  Sleep:      Musculoskeletal: Strength & Muscle Tone: within normal limits Gait & Station: normal Patient leans: N/A  Current Medications: Current Facility-Administered Medications  Medication Dose Route Frequency Provider Last Rate Last Dose  . alum & mag hydroxide-simeth (MAALOX/MYLANTA) 200-200-20 MG/5ML suspension 30 mL  30 mL Oral Q4H PRN Waylan Boga, NP      . Derrill Memo ON 01/16/2014] buPROPion (WELLBUTRIN XL) 24 hr tablet 150 mg  150 mg Oral Daily Waylan Boga, NP      . gabapentin (NEURONTIN) capsule 100 mg  100 mg Oral TID Waylan Boga, NP      . Derrill Memo ON 01/16/2014] LORazepam (ATIVAN) tablet 1 mg  1 mg Oral Q4H PRN Waylan Boga, NP      . Derrill Memo ON 01/17/2014] LORazepam (ATIVAN) tablet 1 mg  1 mg Oral Q6H PRN Waylan Boga, NP      . LORazepam (ATIVAN) tablet 2 mg  2 mg Oral Q4H PRN Waylan Boga, NP   2 mg at 01/15/14 1739  . magnesium hydroxide (MILK OF MAGNESIA) suspension 30 mL  30 mL Oral Daily PRN Waylan Boga, NP      . mirtazapine (REMERON) tablet 15 mg  15 mg Oral QHS Waylan Boga, NP      . Derrill Memo ON 01/16/2014] nicotine (NICODERM CQ - dosed in mg/24 hours) patch 21 mg  21 mg Transdermal Daily Waylan Boga, NP      . traZODone (DESYREL) tablet 50 mg  50 mg Oral QHS PRN Waylan Boga, NP        Lab Results:  Results for orders placed during the hospital encounter of 01/14/14 (from the past 48 hour(s))  URINE RAPID DRUG SCREEN (HOSP PERFORMED)     Status: None   Collection Time    01/14/14  3:41 PM      Result Value Ref Range   Opiates NONE DETECTED  NONE DETECTED   Cocaine NONE DETECTED  NONE DETECTED   Benzodiazepines NONE DETECTED  NONE DETECTED   Amphetamines NONE DETECTED  NONE DETECTED   Tetrahydrocannabinol NONE DETECTED  NONE DETECTED   Barbiturates NONE DETECTED  NONE DETECTED   Comment:            DRUG SCREEN FOR MEDICAL PURPOSES     ONLY.  IF CONFIRMATION  IS NEEDED     FOR ANY PURPOSE, NOTIFY LAB     WITHIN 5 DAYS.                LOWEST DETECTABLE LIMITS     FOR URINE DRUG SCREEN     Drug Class       Cutoff (ng/mL)     Amphetamine      1000     Barbiturate      200     Benzodiazepine   161     Tricyclics       096  Opiates          300     Cocaine          300     THC              50  ACETAMINOPHEN LEVEL     Status: None   Collection Time    01/14/14  4:35 PM      Result Value Ref Range   Acetaminophen (Tylenol), Serum <15.0  10 - 30 ug/mL   Comment:            THERAPEUTIC CONCENTRATIONS VARY     SIGNIFICANTLY. A RANGE OF 10-30     ug/mL MAY BE AN EFFECTIVE     CONCENTRATION FOR MANY PATIENTS.     HOWEVER, SOME ARE BEST TREATED     AT CONCENTRATIONS OUTSIDE THIS     RANGE.     ACETAMINOPHEN CONCENTRATIONS     >150 ug/mL AT 4 HOURS AFTER     INGESTION AND >50 ug/mL AT 12     HOURS AFTER INGESTION ARE     OFTEN ASSOCIATED WITH TOXIC     REACTIONS.  CBC     Status: Abnormal   Collection Time    01/14/14  4:35 PM      Result Value Ref Range   WBC 8.8  4.0 - 10.5 K/uL   RBC 4.33  3.87 - 5.11 MIL/uL   Hemoglobin 13.7  12.0 - 15.0 g/dL   HCT 40.7  36.0 - 46.0 %   MCV 94.0  78.0 - 100.0 fL   MCH 31.6  26.0 - 34.0 pg   MCHC 33.7  30.0 - 36.0 g/dL   RDW 14.8  11.5 - 15.5 %   Platelets 436 (*) 150 - 400 K/uL  COMPREHENSIVE METABOLIC PANEL     Status: Abnormal   Collection Time    01/14/14  4:35 PM      Result Value Ref Range   Sodium 139  137 - 147 mEq/L   Potassium 4.2  3.7 - 5.3 mEq/L   Chloride 101  96 - 112 mEq/L   CO2 21  19 - 32 mEq/L   Glucose, Bld 92  70 - 99 mg/dL   BUN 8  6 - 23 mg/dL   Creatinine, Ser 0.55  0.50 - 1.10 mg/dL   Calcium 9.5  8.4 - 10.5 mg/dL   Total Protein 7.2  6.0 - 8.3 g/dL   Albumin 3.9  3.5 - 5.2 g/dL   AST 26  0 - 37 U/L   ALT 22  0 - 35 U/L   Alkaline Phosphatase 94  39 - 117 U/L   Total Bilirubin <0.2 (*) 0.3 - 1.2 mg/dL   GFR calc non Af Amer >90  >90 mL/min   GFR calc Af Amer  >90  >90 mL/min   Comment: (NOTE)     The eGFR has been calculated using the CKD EPI equation.     This calculation has not been validated in all clinical situations.     eGFR's persistently <90 mL/min signify possible Chronic Kidney     Disease.  ETHANOL     Status: Abnormal   Collection Time    01/14/14  4:35 PM      Result Value Ref Range   Alcohol, Ethyl (B) 224 (*) 0 - 11 mg/dL   Comment:            LOWEST DETECTABLE LIMIT FOR     SERUM  ALCOHOL IS 11 mg/dL     FOR MEDICAL PURPOSES ONLY  SALICYLATE LEVEL     Status: Abnormal   Collection Time    01/14/14  4:35 PM      Result Value Ref Range   Salicylate Lvl <0.9 (*) 2.8 - 20.0 mg/dL    Physical Findings: AIMS: Facial and Oral Movements Muscles of Facial Expression: None, normal Lips and Perioral Area: None, normal Jaw: None, normal Tongue: None, normal,Extremity Movements Upper (arms, wrists, hands, fingers): None, normal Lower (legs, knees, ankles, toes): None, normal, Trunk Movements Neck, shoulders, hips: None, normal, Overall Severity Severity of abnormal movements (highest score from questions above): None, normal Incapacitation due to abnormal movements: None, normal Patient's awareness of abnormal movements (rate only patient's report): No Awareness, Dental Status Current problems with teeth and/or dentures?: No Does patient usually wear dentures?: No  CIWA:  CIWA-Ar Total: 4 COWS:     Treatment Plan Summary: Daily contact with patient to assess and evaluate symptoms and progress in treatment Medication management  Plan: Review of chart, vital signs, medications, and notes.  1-Individual and group therapy  2-Medication management for depression and anxiety: Medications reviewed with the patient and she stated no untoward effects, unchanged. 3-Coping skills for depression, anxiety  4-Continue crisis stabilization and management  5-Address health issues--monitoring vital signs, stable  6-Treatment plan in  progress to prevent relapse of depression and anxiety  Medical Decision Making Problem Points:  Established problem, stable/improving (1), Review of last therapy session (1) and Review of psycho-social stressors (1) Data Points:  Review or order clinical lab tests (1) Review or order medicine tests (1) Review of medication regiment & side effects (2) Review of new medications or change in dosage (2)   Suicide Risk Admission Assessment     Nursing information obtained from:    Demographic factors:    Current Mental Status:    Loss Factors:    Historical Factors:    Risk Reduction Factors:    Total Time spent with patient: Greater than 30 minutes  CLINICAL FACTORS:   Severe Anxiety and/or Agitation Alcohol/Substance Abuse/Dependencies More than one psychiatric diagnosis Previous Psychiatric Diagnoses and Treatments Medical Diagnoses and Treatments/Surgeries  Psychiatric Specialty Exam: SEE PSE ABOVE     Musculoskeletal: SEE ABOVE  COGNITIVE FEATURES THAT CONTRIBUTE TO RISK:  Closed-mindedness    SUICIDE RISK:   Moderate:  Frequent suicidal ideation with limited intensity, and duration, some specificity in terms of plans, no associated intent, good self-control, limited dysphoria/symptomatology, some risk factors present, and identifiable protective factors, including available and accessible social support.  PLAN OF CARE: SEE ABOVE  Benjamine Mola, FNP-BC 01/15/2014, 6:24 PM

## 2014-01-15 NOTE — BHH Counselor (Addendum)
Dr. Gilmore LarocheAkhtar has rescinded pt's IVC. Paperwork faxed to [employer] [employer] and original placed in IVC NB.  Pt has been accepted to the OBS Unit by Claudette Headonrad Withrow NP.   Evette Cristalaroline Paige Rigoberto Repass, [place] Assessment Counselor

## 2014-01-15 NOTE — Progress Notes (Signed)
[facility] CL provided pt with a list of primary care resources, highlighting [facility].  °

## 2014-01-15 NOTE — Progress Notes (Signed)
Patient ID: Danielle Villanueva F Vinal, female   DOB: 1950/10/26, 63 y.o.   MRN: [id number]  D: Patient irritable and making continuous requests and complaints while on the unit. "Ya'll should be quiet so we can watch t.v.; It's ridiculous cold in here; We need to have headphones for the t.v.; Why are there lights in here?"  Pt given multiple snacks and blankets. Pt asking that care provider be quiet while speaking to another patient. RN explained to pt that this is an observation unit and a patient care area and conversations and assessments were going to take place on unit. Pt continues to grumble under breath and remains agitated. Staff trying multiple times to appease pt with no success.  A: Continuous observation, encourage staff interaction, redirect behaviors as needed, address needs as arise, administer medications as ordered by provider. R: No improvement in pt's mood, will continue to offer assistance. Will monitor for safety while on unit.

## 2014-01-15 NOTE — Consult Note (Signed)
[place] Psychiatry Consult   Reason for Consult:  Drinking alcohol and depressed. Referring Physician:  ED Physician  Danielle Villanueva is an 63 y.o. female. Total Time spent with patient: 30 minutes  Assessment: AXIS I:  Substance Induced Mood Disorder and Alcohol use disorder, severe AXIS II:  Deferred AXIS III:   Past Medical History  Diagnosis Date  . Depression   . Anxiety    AXIS IV:  housing problems, other psychosocial or environmental problems, problems related to social environment and problems with primary support group AXIS V:  41-50 serious symptoms  Plan:  Admit to 24 hours bed Unit for observation.  Subjective:   Danielle Villanueva is a 63 y.o. female patient admitted with depressed mood and called 911.  HPI:  Danielle Villanueva is an 63 y.o. female. Pt brought to [facility] by EMS, who report she called 911 stating she was suicidal and planning to [place] [address]. Pt lives in [place] [address] [place]. Pt has, per RN staff, been very irritable since arriving at [facility]. Pt completed about half of the initial [facility] assessment and then told ACT team that she wanted to watch her movie and would not answer any further questions. Any blank questions on the assessment were not answered due to pt refusing to continue. Pt reports she was detoxed at [facility] 12/2013 and then transferred to [facility] [place], where she was discharged on 4/10. [facility] discharge 3/31) Pt reports [facility] "kicked me out" prior to her being able to arrange further treatment and reports she has been drinking the past 3 days at the motel. Pt reports conflict with family.  IVC by cousin was in [place] [address]. Has been a recent discharge from [facility] facility and was hospitalized end march at [facility] for detox. Still started drinking again. Social services has called cousin who mentioned IVC was done because of that time concern of suicide. She has a repetitive pattern of suicidal threats and visit to ED. Says its difficult to stop  drinking i dont have a job. Doc I need a job would do ok.    HPI Elements:   Location:  depression and alcohol. Quality:  moderate. Severity:  recurrent.  Past Psychiatric History: Past Medical History  Diagnosis Date  . Depression   . Anxiety     reports that she has been smoking Cigarettes.  She has been smoking about 1.50 packs per day. She does not have any smokeless tobacco history on file. She reports that she drinks alcohol. She reports that she does not use illicit drugs. No family history on file.   Living Arrangements: Alone Can pt return to current living arrangement?: No (PT said she cannot return to [address], lived there since Jan)   Allergies:   Allergies  Allergen Reactions  . Asa [Aspirin] Nausea And Vomiting    ACT Assessment Complete:  Yes:    Educational Status    Risk to Self: Risk to self Suicidal Ideation: Yes-Currently Present Suicidal Intent:  (unknown) Is patient at risk for suicide?: Yes Suicidal Plan?:  (pt would not say) Access to Means:  (unknown) What has been your use of drugs/alcohol within the last 12 months?: current alcohol use Previous Attempts/Gestures:  (unknown) Intentional Self Injurious Behavior:  (pt would not say) Persecutory voices/beliefs?: No Depression: Yes Depression Symptoms: Despondent Substance abuse history and/or treatment for substance abuse?: Yes Suicide prevention information given to non-admitted patients: Not applicable  Risk to Others:  Risk to Others Homicidal Ideation: No Thoughts of Harm to Others: No Current Homicidal Intent: No Current Homicidal Plan: No Access to Homicidal Means: No History of harm to others?:  (unknown) Does patient have access to weapons?:  (unknown) Criminal Charges Pending?:  (unknown)  Abuse:    Prior Inpatient Therapy: Prior Inpatient Therapy Prior Inpatient Therapy: Yes ([facility], [place].  discharged 01/12/14) Prior Therapy Dates: 12/2013 Prior Therapy Facilty/Provider(s):  [facility] Reason for Treatment: detox  Prior Outpatient Therapy: Prior Outpatient Therapy Prior Outpatient Therapy: No  Additional Information: Additional Information 1:1 In Past 12 Months?: No CIRT Risk: No Elopement Risk: No Does patient have medical clearance?: Yes                  Objective: Blood pressure 156/83, pulse 99, temperature 97.4 F (36.3 C), temperature source Oral, resp. rate 18, SpO2 99.00%.There is no weight on file to calculate BMI. Results for orders placed during the hospital encounter of 01/14/14 (from the past 72 hour(s))  URINE RAPID DRUG SCREEN (HOSP PERFORMED)     Status: None   Collection Time    01/14/14  3:41 PM      Result Value Ref Range   Opiates NONE DETECTED  NONE DETECTED   Cocaine NONE DETECTED  NONE DETECTED   Benzodiazepines NONE DETECTED  NONE DETECTED   Amphetamines NONE DETECTED  NONE DETECTED   Tetrahydrocannabinol NONE DETECTED  NONE DETECTED   Barbiturates NONE DETECTED  NONE DETECTED   Comment:            DRUG SCREEN FOR MEDICAL PURPOSES     ONLY.  IF CONFIRMATION IS NEEDED     FOR ANY PURPOSE, NOTIFY LAB     WITHIN 5 DAYS.                LOWEST DETECTABLE LIMITS     FOR URINE DRUG SCREEN     Drug Class       Cutoff (ng/mL)     Amphetamine      1000     Barbiturate      200     Benzodiazepine   510     Tricyclics       258     Opiates          300     Cocaine          300     THC              50  ACETAMINOPHEN LEVEL     Status: None   Collection Time    01/14/14  4:35 PM      Result Value Ref Range   Acetaminophen (Tylenol), Serum <15.0  10 - 30 ug/mL   Comment:            THERAPEUTIC CONCENTRATIONS VARY     SIGNIFICANTLY. A RANGE OF 10-30     ug/mL MAY BE AN EFFECTIVE     CONCENTRATION FOR MANY PATIENTS.     HOWEVER, SOME ARE BEST TREATED     AT CONCENTRATIONS OUTSIDE THIS     RANGE.     ACETAMINOPHEN CONCENTRATIONS     >150 ug/mL AT 4 HOURS AFTER     INGESTION AND >50 ug/mL AT 12     HOURS AFTER INGESTION  ARE     OFTEN ASSOCIATED WITH TOXIC     REACTIONS.  CBC     Status: Abnormal   Collection Time    01/14/14  4:35 PM  Result Value Ref Range   WBC 8.8  4.0 - 10.5 K/uL   RBC 4.33  3.87 - 5.11 MIL/uL   Hemoglobin 13.7  12.0 - 15.0 g/dL   HCT 40.7  36.0 - 46.0 %   MCV 94.0  78.0 - 100.0 fL   MCH 31.6  26.0 - 34.0 pg   MCHC 33.7  30.0 - 36.0 g/dL   RDW 14.8  11.5 - 15.5 %   Platelets 436 (*) 150 - 400 K/uL  COMPREHENSIVE METABOLIC PANEL     Status: Abnormal   Collection Time    01/14/14  4:35 PM      Result Value Ref Range   Sodium 139  137 - 147 mEq/L   Potassium 4.2  3.7 - 5.3 mEq/L   Chloride 101  96 - 112 mEq/L   CO2 21  19 - 32 mEq/L   Glucose, Bld 92  70 - 99 mg/dL   BUN 8  6 - 23 mg/dL   Creatinine, Ser 0.55  0.50 - 1.10 mg/dL   Calcium 9.5  8.4 - 10.5 mg/dL   Total Protein 7.2  6.0 - 8.3 g/dL   Albumin 3.9  3.5 - 5.2 g/dL   AST 26  0 - 37 U/L   ALT 22  0 - 35 U/L   Alkaline Phosphatase 94  39 - 117 U/L   Total Bilirubin <0.2 (*) 0.3 - 1.2 mg/dL   GFR calc non Af Amer >90  >90 mL/min   GFR calc Af Amer >90  >90 mL/min   Comment: (NOTE)     The eGFR has been calculated using the CKD EPI equation.     This calculation has not been validated in all clinical situations.     eGFR's persistently <90 mL/min signify possible Chronic Kidney     Disease.  ETHANOL     Status: Abnormal   Collection Time    01/14/14  4:35 PM      Result Value Ref Range   Alcohol, Ethyl (B) 224 (*) 0 - 11 mg/dL   Comment:            LOWEST DETECTABLE LIMIT FOR     SERUM ALCOHOL IS 11 mg/dL     FOR MEDICAL PURPOSES ONLY  SALICYLATE LEVEL     Status: Abnormal   Collection Time    01/14/14  4:35 PM      Result Value Ref Range   Salicylate Lvl <1.6 (*) 2.8 - 20.0 mg/dL   Labs are reviewed and are pertinent for 224 alcohol level.  Current Facility-Administered Medications  Medication Dose Route Frequency Provider Last Rate Last Dose  . acetaminophen (TYLENOL) tablet 650 mg  650 mg Oral  Q4H PRN Richarda Blade, MD   650 mg at 01/15/14 1219  . buPROPion (WELLBUTRIN XL) 24 hr tablet 150 mg  150 mg Oral Daily Dot Lanes, MD   150 mg at 01/15/14 0945  . gabapentin (NEURONTIN) capsule 100 mg  100 mg Oral TID Dot Lanes, MD   100 mg at 01/15/14 0945  . mirtazapine (REMERON) tablet 15 mg  15 mg Oral QHS Dot Lanes, MD   15 mg at 01/14/14 2127  . nicotine (NICODERM CQ - dosed in mg/24 hours) patch 21 mg  21 mg Transdermal Daily Wandra Arthurs, MD   21 mg at 01/15/14 0948  . traZODone (DESYREL) tablet 50 mg  50 mg Oral QHS PRN Lurena Nida, NP  50 mg at 01/14/14 2127   Current Outpatient Prescriptions  Medication Sig Dispense Refill  . buPROPion (WELLBUTRIN XL) 150 MG 24 hr tablet Take 1 tablet (150 mg total) by mouth daily. For depression  30 tablet  0  . gabapentin (NEURONTIN) 100 MG capsule Take 1 capsule (100 mg total) by mouth 3 (three) times daily. For alcohol withdrawal syndrome  90 capsule  0  . ibuprofen (ADVIL,MOTRIN) 200 MG tablet Take 2 tablets (400 mg total) by mouth every 6 (six) hours as needed for moderate pain.  30 tablet  0  . mirtazapine (REMERON) 15 MG tablet Take 1 tablet (15 mg total) by mouth at bedtime. For depression/sleep  30 tablet  0    Psychiatric Specialty Exam:     Blood pressure 156/83, pulse 99, temperature 97.4 F (36.3 C), temperature source Oral, resp. rate 18, SpO2 99.00%.There is no weight on file to calculate BMI.  General Appearance: Casual  Eye Contact::  Fair  Speech:  Slow  Volume:  Decreased  Mood:  Dysphoric  Affect:  Constricted  Thought Process:  Linear  Orientation:  Full (Time, Place, and Person)  Thought Content:  Rumination  Suicidal Thoughts:  No  Homicidal Thoughts:  No  Memory:  Recent;   Fair  Judgement:  Poor  Insight:  Shallow  Psychomotor Activity:  Decreased  Concentration:  Fair  Recall:  [place]: Fair  Akathisia:  Negative  Handed:  Right  AIMS (if indicated):      Assets:  Leisure Time  Sleep:      Musculoskeletal: Strength & Muscle Tone: within normal limits Gait & Station: normal Patient leans: Front  Treatment Plan Summary: Daily contact with patient to assess and evaluate symptoms and progress in treatment Medication management Not threatening suicide today but would benefit from 24 hours observation. Vitals to be monitored.   Merian Capron MD 01/15/2014 12:43 PM

## 2014-01-15 NOTE — Progress Notes (Signed)
Patient admitted to OBS unit. Patient states that she is here for alcohol detox and states that she had been drinking five to six beers a day. Patient presents with anxiety, depression, slight tremors, and agitation. Patient is irritable but cooperative with assessment. Patient given education regarding unit rules and regulations. Patient searched and belongings were placed in locker (belongings were not searched per OBS policy). Patient educated regarding fall risk status (low). Will continue to monitor patient for safety.

## 2014-01-15 NOTE — Progress Notes (Signed)
Per psychiatry, patient recommended for observation for 24 hours. Patient ivc rescinded. Pt transferred to observation unit.   Byrd HesselbachKristen Jahzir Strohmeier, LCSW [phone]  ED CSW 01/15/2014 1457pm

## 2014-01-16 DIAGNOSIS — R45851 Suicidal ideations: Secondary | ICD-10-CM

## 2014-01-16 DIAGNOSIS — F10988 Alcohol use, unspecified with other alcohol-induced disorder: Secondary | ICD-10-CM

## 2014-01-16 MED ORDER — IBUPROFEN 600 MG PO TABS
ORAL_TABLET | ORAL | Status: AC
  Filled 2014-01-16: qty 1

## 2014-01-16 MED ORDER — ACETAMINOPHEN 325 MG PO TABS
650.0000 mg | ORAL_TABLET | Freq: Four times a day (QID) | ORAL | Status: DC | PRN
  Administered 2014-01-18 – 2014-01-19 (×2): 650 mg via ORAL
  Filled 2014-01-16 (×3): qty 2

## 2014-01-16 MED ORDER — IBUPROFEN 600 MG PO TABS
600.0000 mg | ORAL_TABLET | Freq: Four times a day (QID) | ORAL | Status: DC | PRN
  Administered 2014-01-16 – 2014-01-25 (×19): 600 mg via ORAL
  Filled 2014-01-16 (×19): qty 1

## 2014-01-16 MED ORDER — ADULT MULTIVITAMIN W/MINERALS CH
1.0000 | ORAL_TABLET | Freq: Every day | ORAL | Status: DC
  Administered 2014-01-16 – 2014-01-25 (×10): 1 via ORAL
  Filled 2014-01-16 (×13): qty 1

## 2014-01-16 MED ORDER — HYDROXYZINE HCL 25 MG PO TABS
25.0000 mg | ORAL_TABLET | Freq: Four times a day (QID) | ORAL | Status: DC | PRN
  Administered 2014-01-16 – 2014-01-18 (×2): 25 mg via ORAL
  Filled 2014-01-16: qty 1
  Filled 2014-01-16: qty 56
  Filled 2014-01-16: qty 1

## 2014-01-16 NOTE — BHH Counselor (Signed)
Adult Psychosocial Assessment Update Interdisciplinary Team  Previous [facility] admissions/discharges:  Admissions Discharges  Date: 12/28/13 Date: 01/02/14  Date: Date:  Date: Date:  Date: Date:  Date: Date:   Changes since the last Psychosocial Assessment (including adherence to outpatient mental health and/or substance abuse treatment, situational issues contributing to decompensation and/or relapse). Sherlynn Stallseggy F Monk is an 63 y.o. female. [facility], who report she called 911 stating she was suicidal and planning [place] [address]. [place]. Pt has, per RN staff, been very irritable since [facility]. Pt completed about [facility] of the [facility] and then told ACT team that she wanted to watch her movie and would not answer any further questions. Any blank questions on the assessment were not answered due to pt refusing to continue. Pt reports [facility] and then [facility] [place], where she was discharged on 4/10. [facility] discharge 3/31) Pt reports [facility] "kicked me out" prior to her being able to arrange further treatment and reports she has been drinking the past 3 days at the motel. Pt reports conflict with family. IVC by cousin [place] [address]. [facility] and [facility]. Still started drinking again. Social services has called cousin who mentioned IVC was done because of that time concern of suicide. She has a repetitive pattern of suicidal threats and visit to ED.              Discharge Plan 1. Will you be returning to the same living situation after discharge?   Yes: No:      If no, what is your plan?    [facility]. Pt made aware that there is a 2 week wait and was encouraged to speak with her support system to figure out where she can stay in the meantime if this is her  plan. Pt reports that [employer] is holding her belongings but is not allowing her to return there to live.        2. Would you like a referral for services when you are discharged? Yes:     If yes, for what services?  No:       PT requesting [facility] referral. Pt recently "kicked out" of [facility] due to dispute with another pt. CSW assessing.        Summary and Recommendations (to be completed by the evaluator) Pt is [age] year old [place], [place] [facility] and [facility] with plan, mood stabilization, ETOH detox, and med management. Recommendations for pt include: crisis stabilization, therapeutic milieu, encourage group attendance and participation, ativan taper for withdrawals, medication management for mood stabilization, and development of comprehensive mental wellness/sobriety plan.                        Signature:  The Sherwin-WilliamsHeather Smart, LCSWA  01/16/2014 3:15 PM

## 2014-01-16 NOTE — BHH Group Notes (Signed)
BHH LCSW Group Therapy  01/16/2014 3:10 PM  Type of Therapy:  Group Therapy  Participation Level:  Active  Participation Quality:  Sharing  Affect:  Excited  Cognitive:  Alert  Insight:  Improving  Engagement in Therapy:  Improving  Modes of Intervention:  Discussion, Education, Exploration, Problem-solving, Rapport Building, Socialization and Support  Summary of Progress/Problems: MHA Speaker came to talk about his personal journey with substance abuse and addiction. The pt processed ways by which to relate to the speaker. MHA speaker provided handouts and educational information pertaining to groups and services offered by the [facility]. Danielle Villanueva was attentive during today's therapy group. She actively listened as speaker shared his personal story and reviewed various services offered by [facility]. Danielle Villanueva shows some progress in the group setting at this time.    Danielle Villanueva Smart LCSWA  01/16/2014, 3:10 PM

## 2014-01-16 NOTE — BHH Group Notes (Signed)
Adult Psychoeducational Group Note  Date:  01/16/2014 Time:  10:32 PM  Group Topic/Focus:  AA Meeting  Participation Level:  Minimal  Participation Quality:  Attentive  Affect:  Appropriate  Cognitive:  Alert  Insight: Limited  Engagement in Group:  Limited  Modes of Intervention:  Discussion and Education  Additional Comments:  Danielle Villanueva attended AA group.  [name] A Lillia AbedLindsay 01/16/2014, 10:32 PM

## 2014-01-16 NOTE — Progress Notes (Signed)
Patient ID: Danielle Villanueva, female   DOB: Feb 23, 1951, 63 y.o.   MRN: [id number] D: patient presents with flat, blunted affect.  Patient is labile exhibiting irritability and impatience.  Patient came to med window and asked, "where are my four o'clock meds?"  Explained to patient that her meds were due at 1700.  Asked how patient was doing and she walked off stating, "I'm just FINE!"  She has been up in the milieu requesting extra sheets and blankets.  She was irritated that her clothes could not be washed right away.  Patient denies any SI/HI/AVH.  A: continue to monitor medication management and MD orders.  Safety checks completed every 15 minutes per protocol.  R: patient remains irritable and short with staff.

## 2014-01-16 NOTE — Progress Notes (Signed)
Case discussed, agree with plan 

## 2014-01-16 NOTE — Progress Notes (Signed)
Recreation Therapy Notes  Animal-Assisted Activity/Therapy (AAA/T) Program Checklist/Progress Notes Patient Eligibility Criteria Checklist & Daily Group note for Rec Tx Intervention  Date: 04.14.2015 Time: 2:45am Location: 500 Hall Dayroom    AAA/T Program Assumption of Risk Form signed by Patient/ or Parent Legal Guardian yes  Patient is free of allergies or sever asthma yes  Patient reports no fear of animals yes  Patient reports no history of cruelty to animals yes   Patient understands his/her participation is voluntary yes  Behavioral Response: Did not attend.   Danielle Villanueva, LRT/CTRS  Danielle Villanueva 01/16/2014 5:11 PM 

## 2014-01-16 NOTE — Progress Notes (Signed)
D: Pt denies SI/HI/AVH. Pt is pleasant and cooperative. Pt forwards little, pt evasive about answering questions. Pt complains of generalized pain.   A: Pt was offered support and encouragement. Pt was given scheduled medications and ibuprofen for pain. Pt was encourage to attend groups. Q 15 minute checks were done for safety.   R:Pt attends groups and interacts well with peers and staff. Pt is taking medication.Pt receptive to treatment and safety maintained on unit.

## 2014-01-16 NOTE — Progress Notes (Signed)
NUTRITION ASSESSMENT  Pt identified as at risk on the Malnutrition Screen Tool  INTERVENTION: - Educated patient on the importance of nutrition and encouraged intake of food and beverages. - Multivitamin 1 tablet PO daily   NUTRITION DIAGNOSIS: Altered GI function related to alcohol abuse as evidenced by pt report.   Goal: Pt to meet >/= 90% of their estimated nutrition needs.  Monitor:  PO intake  Assessment:  Admitted for alcohol detox and states that she had been drinking five to six beers a day. Patient presents with anxiety, depression, slight tremors, and agitation. Pt brought to [facility] by EMS, who report she called 911 stating she was suicidal and planning to [place] [address]. [place].   Met with pt who reports her intake PTA was 1 meal/day, usually bread. States she doesn't like some of the food here and only ate some of the corn today because she picked around the parts that looked like bugs. Reports 4 pound weight gain in the past 2.5 weeks. Does not like or want any nutritional supplements. Admits to being a picky eater.    63 y.o. female  Height: Ht Readings from Last 1 Encounters:  01/15/14 _0  (1.549 m)    Weight: Wt Readings from Last 1 Encounters:  01/15/14 106 lb (48.081 kg)    Weight Hx: Wt Readings from Last 10 Encounters:  01/15/14 106 lb (48.081 kg)  12/28/13 101 lb (45.813 kg)    BMI:  Body mass index is 20.04 kg/(m^2). Pt meets criteria for normal weight based on current BMI.  Estimated Nutritional Needs: Kcal: 25-30 kcal/kg Protein: > 1 gram protein/kg Fluid: 1 ml/kcal  Diet Order: General Pt is also offered choice of unit snacks mid-morning and mid-afternoon.  Pt is eating as desired.   Lab results and medications reviewed.   Mikey College MS, [place], [place] Pager [phone] After Hours Pager

## 2014-01-16 NOTE — Progress Notes (Signed)
Patient ID: Danielle Villanueva, female   DOB: 03-31-1951, 63 y.o.   MRN: [id number] [facility] OBS PROGRESS NOTE  01/16/2014 3:49 PM KIOSHA BUCHAN  MRN:  [id number] Subjective: During today's assessment, pt is crying profusely when talking about how she will hurt herself if she has no place to stay. Pt was informed that this was situational depression, however, pt continued to cry profusely stating that she knows she was going to hurt herself for sure if she left now. Pt was unable to contract for safety at this time. Referrals unable to be secured and pt has hx of being denied at [facility] for residency. Pt needs help with alcohol detoxification concerns and programming in addition to suicidal ideation with statements about being "afraid I'll walk into traffic... I just can't take it". Pt denies HI, and AVH.   HPI: Danielle Villanueva is an 63 y.o. female. Pt brought to [facility] by EMS, who report she called 911 stating she was suicidal and planning to [place] [address]. [place]. Pt has, per RN staff, been very irritable since arriving at [facility]. Pt completed about half of the initial [facility] assessment and then told ACT team that she wanted to watch her movie and would not answer any further questions. Any blank questions on the assessment were not answered due to pt refusing to continue. Pt reports she was detoxed at [facility] 12/2013 and then transferred to [facility], [place], where she was discharged on 4/10. [facility] discharge 3/31) Pt reports [facility] "kicked me out" prior to her being able to arrange further treatment and reports she has been drinking the past 3 days at the motel. Pt reports conflict with family. IVC by cousin [place] [address]. Has been a recent discharge from [facility] facility and was hospitalized end march [facility]. Still started drinking again. Social services has called cousin who mentioned IVC was done because of that time concern of suicide. She has a repetitive pattern of  suicidal threats and visit to ED. Says its difficult to stop drinking i dont have a job. Doc I need a job would do ok.   Diagnosis:   DSM5:  Substance/Addictive Disorders:  Alcohol Intoxication with Use Disorder - Severe (F10.229) Depressive Disorders:  Major Depressive Disorder - Severe (296.23) Total Time spent with patient: Greater than 30 minutes  Axis I: Alcohol Abuse, Major Depression, Recurrent severe and Substance Induced Mood Disorder Axis II: Deferred Axis III:  Past Medical History  Diagnosis Date  . Depression   . Anxiety    Axis IV: other psychosocial or environmental problems and problems related to social environment Axis V: 41-50 serious symptoms  ADL's:  Intact  Sleep: Fair  Appetite:  Good  Suicidal Ideation:  SI with plan to jump in front of car; contracts for safety. Homicidal Ideation:  Denies AEB (as evidenced by):  Psychiatric Specialty Exam: Physical Exam Full Physical Exam performed in ED; reviewed, stable, and I concur with this assessment.   Review of Systems  Constitutional: Negative.   HENT: Negative.   Eyes: Negative.   Respiratory: Negative.   Cardiovascular: Negative.   Gastrointestinal: Negative.   Genitourinary: Negative.   Musculoskeletal: Negative.   Skin: Negative.   Neurological: Negative.   Endo/Heme/Allergies: Negative.   Psychiatric/Behavioral: Positive for depression. The patient is nervous/anxious.     Blood pressure 91/61, pulse 74, temperature 97.6 F (36.4 C), temperature source Oral, resp. rate 18,  height _0  (1.549 m), weight 48.081 kg (106 lb), SpO2 98.00%.Body mass index is 20.04 kg/(m^2).  General Appearance: Disheveled  Eye Sport and exercise psychologist::  Fair  Speech:  Clear and Coherent  Volume:  Normal  Mood:  Anxious and Depressed  Affect:  Appropriate and Depressed  Thought Process:  Coherent  Orientation:  Full (Time, Place, and Person)  Thought Content:  WDL  Suicidal Thoughts:  Yes.  with intent/plan  Homicidal  Thoughts:  No  Memory:  Immediate;   Fair Recent;   Fair Remote;   Fair  Judgement:  Fair  Insight:  Fair  Psychomotor Activity:  Decreased  Concentration:  Good  Recall:  Progress: Fair  Akathisia:  NA  Handed:  Right  AIMS (if indicated):     Assets:  Desire for Improvement Resilience  Sleep:      Musculoskeletal: Strength & Muscle Tone: within normal limits Gait & Station: normal Patient leans: N/A  Current Medications: Current Facility-Administered Medications  Medication Dose Route Frequency Provider Last Rate Last Dose  . acetaminophen (TYLENOL) tablet 650 mg  650 mg Oral Q6H PRN Encarnacion Slates, NP      . alum & mag hydroxide-simeth (MAALOX/MYLANTA) 200-200-20 MG/5ML suspension 30 mL  30 mL Oral Q4H PRN Waylan Boga, NP      . buPROPion (WELLBUTRIN XL) 24 hr tablet 150 mg  150 mg Oral Daily Waylan Boga, NP   150 mg at 01/16/14 0721  . gabapentin (NEURONTIN) capsule 100 mg  100 mg Oral TID Waylan Boga, NP   100 mg at 01/16/14 1129  . hydrOXYzine (ATARAX/VISTARIL) tablet 25 mg  25 mg Oral Q6H PRN Encarnacion Slates, NP      . ibuprofen (ADVIL,MOTRIN) tablet 600 mg  600 mg Oral Q6H PRN Benjamine Mola, FNP   600 mg at 01/16/14 1131  . [START ON 01/17/2014] LORazepam (ATIVAN) tablet 1 mg  1 mg Oral Q6H PRN Waylan Boga, NP      . LORazepam (ATIVAN) tablet 2 mg  2 mg Oral Q4H PRN Waylan Boga, NP   2 mg at 01/15/14 2050  . magnesium hydroxide (MILK OF MAGNESIA) suspension 30 mL  30 mL Oral Daily PRN Waylan Boga, NP      . mirtazapine (REMERON) tablet 15 mg  15 mg Oral QHS Waylan Boga, NP      . multivitamin with minerals tablet 1 tablet  1 tablet Oral Daily Christie Beckers, RD      . nicotine (NICODERM CQ - dosed in mg/24 hours) patch 21 mg  21 mg Transdermal Daily Waylan Boga, NP   21 mg at 01/16/14 0721  . ondansetron (ZOFRAN-ODT) disintegrating tablet 4 mg  4 mg Oral Q8H PRN Benjamine Mola, FNP      . traZODone (DESYREL) tablet 50 mg  50 mg Oral  QHS PRN Waylan Boga, NP        Lab Results:  Results for orders placed during the hospital encounter of 01/14/14 (from the past 48 hour(s))  ACETAMINOPHEN LEVEL     Status: None   Collection Time    01/14/14  4:35 PM      Result Value Ref Range   Acetaminophen (Tylenol), Serum <15.0  10 - 30 ug/mL   Comment:            THERAPEUTIC CONCENTRATIONS VARY     SIGNIFICANTLY. A RANGE OF 10-30     ug/mL MAY BE AN EFFECTIVE  CONCENTRATION FOR MANY PATIENTS.     HOWEVER, SOME ARE BEST TREATED     AT CONCENTRATIONS OUTSIDE THIS     RANGE.     ACETAMINOPHEN CONCENTRATIONS     >150 ug/mL AT 4 HOURS AFTER     INGESTION AND >50 ug/mL AT 12     HOURS AFTER INGESTION ARE     OFTEN ASSOCIATED WITH TOXIC     REACTIONS.  CBC     Status: Abnormal   Collection Time    01/14/14  4:35 PM      Result Value Ref Range   WBC 8.8  4.0 - 10.5 K/uL   RBC 4.33  3.87 - 5.11 MIL/uL   Hemoglobin 13.7  12.0 - 15.0 g/dL   HCT 40.7  36.0 - 46.0 %   MCV 94.0  78.0 - 100.0 fL   MCH 31.6  26.0 - 34.0 pg   MCHC 33.7  30.0 - 36.0 g/dL   RDW 14.8  11.5 - 15.5 %   Platelets 436 (*) 150 - 400 K/uL  COMPREHENSIVE METABOLIC PANEL     Status: Abnormal   Collection Time    01/14/14  4:35 PM      Result Value Ref Range   Sodium 139  137 - 147 mEq/L   Potassium 4.2  3.7 - 5.3 mEq/L   Chloride 101  96 - 112 mEq/L   CO2 21  19 - 32 mEq/L   Glucose, Bld 92  70 - 99 mg/dL   BUN 8  6 - 23 mg/dL   Creatinine, Ser 0.55  0.50 - 1.10 mg/dL   Calcium 9.5  8.4 - 10.5 mg/dL   Total Protein 7.2  6.0 - 8.3 g/dL   Albumin 3.9  3.5 - 5.2 g/dL   AST 26  0 - 37 U/L   ALT 22  0 - 35 U/L   Alkaline Phosphatase 94  39 - 117 U/L   Total Bilirubin <0.2 (*) 0.3 - 1.2 mg/dL   GFR calc non Af Amer >90  >90 mL/min   GFR calc Af Amer >90  >90 mL/min   Comment: (NOTE)     The eGFR has been calculated using the CKD EPI equation.     This calculation has not been validated in all clinical situations.     eGFR's persistently <90 mL/min  signify possible Chronic Kidney     Disease.  ETHANOL     Status: Abnormal   Collection Time    01/14/14  4:35 PM      Result Value Ref Range   Alcohol, Ethyl (B) 224 (*) 0 - 11 mg/dL   Comment:            LOWEST DETECTABLE LIMIT FOR     SERUM ALCOHOL IS 11 mg/dL     FOR MEDICAL PURPOSES ONLY  SALICYLATE LEVEL     Status: Abnormal   Collection Time    01/14/14  4:35 PM      Result Value Ref Range   Salicylate Lvl <6.3 (*) 2.8 - 20.0 mg/dL    Physical Findings: AIMS: Facial and Oral Movements Muscles of Facial Expression: None, normal Lips and Perioral Area: None, normal Jaw: None, normal Tongue: None, normal,Extremity Movements Upper (arms, wrists, hands, fingers): None, normal Lower (legs, knees, ankles, toes): None, normal, Trunk Movements Neck, shoulders, hips: None, normal, Overall Severity Severity of abnormal movements (highest score from questions above): None, normal Incapacitation due to abnormal movements: None, normal Patient's awareness of  abnormal movements (rate only patient's report): No Awareness, Dental Status Current problems with teeth and/or dentures?: No Does patient usually wear dentures?: No  CIWA:  CIWA-Ar Total: 6 COWS:     Treatment Plan Summary: Daily contact with patient to assess and evaluate symptoms and progress in treatment Medication management  Plan: Review of chart, vital signs, medications, and notes.  1-Individual and group therapy  2-Medication management for depression and anxiety: Medications reviewed with the patient and she stated no untoward effects, unchanged. 3-Coping skills for depression, anxiety  4-Continue crisis stabilization and management  5-Address health issues--monitoring vital signs, stable  6-Treatment plan in progress to prevent relapse of depression and anxiety  Medical Decision Making Problem Points:  Established problem, worsening (2), Review of last therapy session (1) and Review of psycho-social stressors  (1) Data Points:  Review or order clinical lab tests (1) Review or order medicine tests (1) Review of medication regiment & side effects (2) Review of new medications or change in dosage (2)   Benjamine Mola, FNP-BC 01/16/2014, 3:49 PM

## 2014-01-17 NOTE — Progress Notes (Signed)
Attended group 

## 2014-01-17 NOTE — BHH Group Notes (Signed)
BHH LCSW Group Therapy  01/17/2014 3:11 PM  Type of Therapy:  Group Therapy  Participation Level:  None  Participation Quality:  Inattentive and Resistant  Affect:  Flat and Irritable  Cognitive:  Lacking  Insight:  None  Engagement in Therapy:  None  Modes of Intervention:  Confrontation, Discussion, Education, Exploration, Problem-solving, Rapport Building, Socialization and Support  Summary of Progress/Problems: Emotion Regulation: This group focused on both positive and negative emotion identification and allowed group members to process ways to identify feelings, regulate negative emotions, and find healthy ways to manage internal/external emotions. Group members were asked to reflect on a time when their reaction to an emotion led to a negative outcome and explored how alternative responses using emotion regulation would have benefited them. Group members were also asked to discuss a time when emotion regulation was utilized when a negative emotion was experienced. Danielle Villanueva attended group but refused to actively participate in group discussion. She wrote letter to her son during group. Danielle Villanueva was asked to participate but stated, "I need to see you privately." (to CSW). Danielle Villanueva shows no progress in the group setting at this time.    Susan Arana Smart LCSWA  01/17/2014, 3:11 PM

## 2014-01-17 NOTE — BHH Group Notes (Signed)
[facility]   01/17/2014 11:27 AM  Participation Quality:  Appropriate   Mood/Affect:  Irritable  Depression Rating:  9  Anxiety Rating:  9  Thoughts of Suicide:  No Will you contract for safety?   NA  Current AVH:  No  Plan for Discharge/Comments:  Pt [facility] and return to hospital. CSW explained that this was not possible, if pt was d/ced she would not be able to return. Pt agitated but verbalized understanding. Pt referral made to [facility]. Pt on waiting list. Pt reports that [facility] due to dispute with another pt.   Transportation Means: unknown at this time.   Supports: strained relationship with children. No identified supports   Teaching laboratory technicianHeather Smart LCSWA

## 2014-01-17 NOTE — Progress Notes (Signed)
Pt reported her sleep as poor. Her appetite is improving energy low and ability to pay attention as poor.  Depression 2 hopelessness 2 and anxiety a 8 on her self-inventory.  Denies any S/H ideations or A/V hallucinations. She has been up and active in the milieu today.  She was very angry this morning yelling in the hallway and threw several magazines on the floor she picked them up and threw them again. She stated,"I don't have any money and I am about to lose my furniture"  She was able to calm down as I let her vent.  She is hoping to get into [facility] at discharge.

## 2014-01-17 NOTE — H&P (Signed)
Psychiatric Admission Assessment Adult  Patient Identification:  Danielle Villanueva  Date of Evaluation:  01/17/2014  Chief Complaint:  SUBSTANCE INDUCED MOOD DISORDER   History of Present Illness: Danielle Villanueva is 63 years old, Caucasian female. Recently discharged from this hospital to the [facility] residential for further treatment. Jourden reports, "I was asked to leave [facility] on my 10th day of the substance abuse treatment. I was accused of breaking the patient's confidentiality. There was a girl at the treatment center who was trying to become a man. They said that I had a fight with her and that was a lie. I don't fight with people. I was asked to leave the center same day. I went to the [facility] tx center because my son/daughter promised me to go to [facility] and they will pay my storage fees. They broke their promises to me. They declined to pay my storage fees citing financial stressors.  I left [facility] and started drinking alcohol the same day. I'm back here again. I need help".  Elements: Location: alcoholism, increased depression, anxiety.  Quality: Hopelessness, suicidal ideations, crying spells, increased alcohol consumption.  Severity: Severe.  Timing: Started drinking again last Friday.  Duration: Chronic, been drinking x 10 years.  Context: "I feel broken, disappointed of my children, I drank to cope".  Associated Signs/Synptoms:  Depression Symptoms:  depressed mood, feelings of worthlessness/guilt, anxiety,  (Hypo) Manic Symptoms:  Impulsivity, Irritable Mood, Labiality of Mood,  Anxiety Symptoms:  Excessive Worry,  Psychotic Symptoms:  Hallucinations: None  PTSD Symptoms: Had a traumatic exposure:  Denies  Total Time spent with patient: 1 hour  Psychiatric Specialty Exam: Physical Exam  Constitutional: She is oriented to person, place, and time. She appears well-developed.  HENT:  Head: Normocephalic.  Eyes: Pupils are equal, round, and reactive to  light.  Neck: Normal range of motion.  Cardiovascular: Normal rate.   Respiratory: Effort normal.  GI: Soft.  Musculoskeletal: Normal range of motion.  Neurological: She is alert and oriented to person, place, and time.  Skin: Skin is warm and dry.  Psychiatric: Her speech is normal. Thought content normal. Her mood appears anxious. Her affect is angry and blunt. She is agitated and aggressive. Cognition and memory are normal. She expresses impulsivity. She exhibits a depressed mood.    Review of Systems  Constitutional: Positive for malaise/fatigue.  HENT: Negative.   Eyes: Negative.   Respiratory: Negative.   Cardiovascular: Negative.   Gastrointestinal: Negative.   Genitourinary: Negative.   Musculoskeletal: Negative.   Skin: Negative.   Neurological: Positive for dizziness, tremors and weakness.  Endo/Heme/Allergies: Negative.   Psychiatric/Behavioral: Positive for depression and substance abuse (Alcoholism). Negative for suicidal ideas and memory loss. The patient is nervous/anxious and has insomnia.     Blood pressure 103/76, pulse 106, temperature 98.2 F (36.8 C), temperature source Oral, resp. rate 15, height _0  (1.549 m), weight 48.081 kg (106 lb), SpO2 98.00%.Body mass index is 20.04 kg/(m^2).  General Appearance: Casual and Fairly Groomed  Eye Contact::  Good  Speech:  Clear and Coherent  Volume:  Normal  Mood:  Anxious, Depressed and rated both at #10  Affect:  Labile  Thought Process:  Coherent  Orientation:  Full (Time, Place, and Person)  Thought Content:  Rumination  Suicidal Thoughts:  No  Homicidal Thoughts:  No  Memory:  Immediate;   Good Recent;   Good Remote;   Good  Judgement:  Poor  Insight:  Lacking  Psychomotor Activity:  Restlessness  Concentration:  Poor  Recall:  Good  Fund of Knowledge:Poor  Language: Fair  Akathisia:  No  Handed:  Right  AIMS (if indicated):     Assets:  Desire for Improvement  Sleep:       Musculoskeletal: Strength & Muscle Tone: within normal limits Gait & Station: normal Patient leans: N/A  Past Psychiatric History: Diagnosis: Alcohol Related Disorder - Severe (303.90), Substance induced mood disorder   Hospitalizations: [facility] adult unit   Outpatient Care: [facility]  Substance Abuse Care: Would like to go to a [facility] after discharge   Self-Mutilation: Denies   Suicidal Attempts: Denies attempts, admits thoughts   Violent Behaviors: Denies    Past Medical History:   Past Medical History  Diagnosis Date  . Depression   . Anxiety    None.  Allergies:   Allergies  Allergen Reactions  . Asa [Aspirin] Nausea And Vomiting   PTA Medications: Prescriptions prior to admission  Medication Sig Dispense Refill  . buPROPion (WELLBUTRIN XL) 150 MG 24 hr tablet Take 1 tablet (150 mg total) by mouth daily. For depression  30 tablet  0  . gabapentin (NEURONTIN) 100 MG capsule Take 1 capsule (100 mg total) by mouth 3 (three) times daily. For alcohol withdrawal syndrome  90 capsule  0  . ibuprofen (ADVIL,MOTRIN) 200 MG tablet Take 2 tablets (400 mg total) by mouth every 6 (six) hours as needed for moderate pain.  30 tablet  0  . mirtazapine (REMERON) 15 MG tablet Take 1 tablet (15 mg total) by mouth at bedtime. For depression/sleep  30 tablet  0    Previous Psychotropic Medications:  Medication/Dose  See medication lists above               Substance Abuse History in the last 12 months:  yes  Consequences of Substance Abuse: Medical Consequences:  Liver damage, Possible death by overdose Legal Consequences:  Arrests, jail time, Loss of driving privilege. Family Consequences:  Family discord, divorce and or separation.  Social History:  reports that she has been smoking Cigarettes.  She has been smoking about 1.50 packs per day. She does not have any smokeless tobacco history on file. She reports that she drinks alcohol. She reports that she does not use illicit  drugs. Additional Social History: Current Place of Residence: [place], [place] of Birth: [place], [place]   Family Members: "I have 2 children"   Marital Status: Single   Children: 2  Sons: 1  Daughters: 1   Relationships: Single   Education: HS Tour manager Problems/Performance: Completed high school   Religious Beliefs/Practices: NA   History of Abuse (Emotional/Phsycial/Sexual): Denies   Occupational Experiences: Chartered loss adjuster History: None.   Legal History: Denies any pending legal charges   Hobbies/Interests: NA   Family History:  History reviewed. No pertinent family history.  Results for orders placed during the hospital encounter of 01/14/14 (from the past 72 hour(s))  URINE RAPID DRUG SCREEN (HOSP PERFORMED)     Status: None   Collection Time    01/14/14  3:41 PM      Result Value Ref Range   Opiates NONE DETECTED  NONE DETECTED   Cocaine NONE DETECTED  NONE DETECTED   Benzodiazepines NONE DETECTED  NONE DETECTED   Amphetamines NONE DETECTED  NONE DETECTED   Tetrahydrocannabinol NONE DETECTED  NONE DETECTED   Barbiturates NONE DETECTED  NONE DETECTED   Comment:  DRUG SCREEN FOR MEDICAL PURPOSES     ONLY.  IF CONFIRMATION IS NEEDED     FOR ANY PURPOSE, NOTIFY LAB     WITHIN 5 DAYS.                LOWEST DETECTABLE LIMITS     FOR URINE DRUG SCREEN     Drug Class       Cutoff (ng/mL)     Amphetamine      1000     Barbiturate      200     Benzodiazepine   157     Tricyclics       262     Opiates          300     Cocaine          300     THC              50  ACETAMINOPHEN LEVEL     Status: None   Collection Time    01/14/14  4:35 PM      Result Value Ref Range   Acetaminophen (Tylenol), Serum <15.0  10 - 30 ug/mL   Comment:            THERAPEUTIC CONCENTRATIONS VARY     SIGNIFICANTLY. A RANGE OF 10-30     ug/mL MAY BE AN EFFECTIVE     CONCENTRATION FOR MANY PATIENTS.     HOWEVER, SOME ARE BEST TREATED     AT  CONCENTRATIONS OUTSIDE THIS     RANGE.     ACETAMINOPHEN CONCENTRATIONS     >150 ug/mL AT 4 HOURS AFTER     INGESTION AND >50 ug/mL AT 12     HOURS AFTER INGESTION ARE     OFTEN ASSOCIATED WITH TOXIC     REACTIONS.  CBC     Status: Abnormal   Collection Time    01/14/14  4:35 PM      Result Value Ref Range   WBC 8.8  4.0 - 10.5 K/uL   RBC 4.33  3.87 - 5.11 MIL/uL   Hemoglobin 13.7  12.0 - 15.0 g/dL   HCT 40.7  36.0 - 46.0 %   MCV 94.0  78.0 - 100.0 fL   MCH 31.6  26.0 - 34.0 pg   MCHC 33.7  30.0 - 36.0 g/dL   RDW 14.8  11.5 - 15.5 %   Platelets 436 (*) 150 - 400 K/uL  COMPREHENSIVE METABOLIC PANEL     Status: Abnormal   Collection Time    01/14/14  4:35 PM      Result Value Ref Range   Sodium 139  137 - 147 mEq/L   Potassium 4.2  3.7 - 5.3 mEq/L   Chloride 101  96 - 112 mEq/L   CO2 21  19 - 32 mEq/L   Glucose, Bld 92  70 - 99 mg/dL   BUN 8  6 - 23 mg/dL   Creatinine, Ser 0.55  0.50 - 1.10 mg/dL   Calcium 9.5  8.4 - 10.5 mg/dL   Total Protein 7.2  6.0 - 8.3 g/dL   Albumin 3.9  3.5 - 5.2 g/dL   AST 26  0 - 37 U/L   ALT 22  0 - 35 U/L   Alkaline Phosphatase 94  39 - 117 U/L   Total Bilirubin <0.2 (*) 0.3 - 1.2 mg/dL   GFR calc non Af Amer >90  >90 mL/min   GFR calc  Af Amer >90  >90 mL/min   Comment: (NOTE)     The eGFR has been calculated using the CKD EPI equation.     This calculation has not been validated in all clinical situations.     eGFR's persistently <90 mL/min signify possible Chronic Kidney     Disease.  ETHANOL     Status: Abnormal   Collection Time    01/14/14  4:35 PM      Result Value Ref Range   Alcohol, Ethyl (B) 224 (*) 0 - 11 mg/dL   Comment:            LOWEST DETECTABLE LIMIT FOR     SERUM ALCOHOL IS 11 mg/dL     FOR MEDICAL PURPOSES ONLY  SALICYLATE LEVEL     Status: Abnormal   Collection Time    01/14/14  4:35 PM      Result Value Ref Range   Salicylate Lvl <3.2 (*) 2.8 - 20.0 mg/dL   Psychological Evaluations:  Assessment:    DSM5: Schizophrenia Disorders:  NA Obsessive-Compulsive Disorders:  NA Trauma-Stressor Disorders:  NA Substance/Addictive Disorders:  Alcohol Related Disorder - Severe (303.90) Depressive Disorders:  Alcohol induced mood disorder  AXIS I:  Alcohol dependence,  Alcohol induced mood disorder AXIS II:  Deferred AXIS III:   Past Medical History  Diagnosis Date  . Depression   . Anxiety    AXIS IV:  economic problems, housing problems, occupational problems and Alcoholism, chronic AXIS V:  1-10 persistent dangerousness to self and others present  Treatment Plan/Recommendations: 1. Admit for crisis management and stabilization, estimated length of stay 3-5 days.  2. Medication management to reduce current symptoms to base line and improve the patient's overall level of functioning  3. Treat health problems as indicated.  4. Develop treatment plan to decrease risk of relapse upon discharge and the need for readmission.  5. Psycho-social education regarding relapse prevention and self care.  6. Health care follow up as needed for medical problems.  7. Review, reconcile, and reinstate any pertinent home medications for other health issues where appropriate. 8. Call for consults with hospitalist for any additional specialty patient care services as needed.  Treatment Plan Summary: Daily contact with patient to assess and evaluate symptoms and progress in treatment Medication management  Current Medications:  Current Facility-Administered Medications  Medication Dose Route Frequency Provider Last Rate Last Dose  . acetaminophen (TYLENOL) tablet 650 mg  650 mg Oral Q6H PRN Encarnacion Slates, NP      . alum & mag hydroxide-simeth (MAALOX/MYLANTA) 200-200-20 MG/5ML suspension 30 mL  30 mL Oral Q4H PRN Waylan Boga, NP      . buPROPion (WELLBUTRIN XL) 24 hr tablet 150 mg  150 mg Oral Daily Waylan Boga, NP   150 mg at 01/17/14 0819  . gabapentin (NEURONTIN) capsule 100 mg  100 mg Oral TID Waylan Boga, NP   100 mg at 01/17/14 0819  . hydrOXYzine (ATARAX/VISTARIL) tablet 25 mg  25 mg Oral Q6H PRN Encarnacion Slates, NP   25 mg at 01/16/14 2121  . ibuprofen (ADVIL,MOTRIN) tablet 600 mg  600 mg Oral Q6H PRN Benjamine Mola, FNP   600 mg at 01/16/14 1938  . LORazepam (ATIVAN) tablet 1 mg  1 mg Oral Q6H PRN Waylan Boga, NP      . magnesium hydroxide (MILK OF MAGNESIA) suspension 30 mL  30 mL Oral Daily PRN Waylan Boga, NP      . mirtazapine (REMERON)  tablet 15 mg  15 mg Oral QHS Waylan Boga, NP   15 mg at 01/16/14 2121  . multivitamin with minerals tablet 1 tablet  1 tablet Oral Daily Christie Beckers, RD   1 tablet at 01/17/14 0819  . nicotine (NICODERM CQ - dosed in mg/24 hours) patch 21 mg  21 mg Transdermal Daily Waylan Boga, NP   21 mg at 01/17/14 0820  . ondansetron (ZOFRAN-ODT) disintegrating tablet 4 mg  4 mg Oral Q8H PRN Benjamine Mola, FNP      . traZODone (DESYREL) tablet 50 mg  50 mg Oral QHS PRN Waylan Boga, NP        Observation Level/Precautions:  15 minute checks  Laboratory:  Per ED lab reports, BAL 224  Psychotherapy:  Group sessions, AA/NA meetings  Medications:  See medication lists  Consultations:  As needed  Discharge Concerns:  Maintaining sobriety  Estimated LOS: 2-4 days  Other:     I certify that inpatient services furnished can reasonably be expected to improve the patient's condition.   Encarnacion Slates, PMHNP, FNP-BC 4/15/201510:26 AM Personally evaluated the patient, reviewed the physical exam and agree with assessment and plan Geralyn Flash A. [place], [place].D
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
## 2014-01-17 NOTE — [facility] Suicide Risk Assessment (Signed)
Suicide Risk Assessment  Admission Assessment     Nursing information obtained from:    Demographic factors:    Current Mental Status:    Loss Factors:    Historical Factors:    Risk Reduction Factors:    Total Time spent with patient: 45 minutes  CLINICAL FACTORS:   Depression:   Comorbid alcohol abuse/dependence Alcohol/Substance Abuse/Dependencies  Psychiatric Specialty Exam:     Blood pressure 103/76, pulse 106, temperature 98.2 F (36.8 C), temperature source Oral, resp. rate 15, height 5\' 1"  (1.549 m), weight 48.081 kg (106 lb), SpO2 98.00%.Body mass index is 20.04 kg/(m^2).  General Appearance: Fairly Groomed  Patent attorneyye Contact::  Minimal  Speech:  Clear and Coherent and Slow  Volume:  Decreased  Mood:  Anxious, Depressed and Irritable  Affect:  Restricted  Thought Process:  Coherent and Goal Directed  Orientation:  Full (Time, Place, and Person)  Thought Content:  events, symtpoms, worries, concerns  Suicidal Thoughts:  No  Homicidal Thoughts:  No  Memory:  Immediate;   Fair Recent;   Fair Remote;   Fair  Judgement:  Fair  Insight:  Present and Shallow  Psychomotor Activity:  Restlessness  Concentration:  Fair  Recall:  Fis[employer]Fair  Fund of Knowledge:NA  Language: Fair  Akathisia:  No  Handed:    AIMS (if indicated):     Assets:  Desire for Improvement  Sleep:      Musculoskeletal: Strength & Muscle Tone: within normal limits Gait & Station: normal Patient leans: N/A  COGNITIVE FEATURES THAT CONTRIBUTE TO RISK:  Closed-mindedness Polarized thinking Thought constriction (tunnel vision)    SUICIDE RISK:   Moderate:  Frequent suicidal ideation with limited intensity, and duration, some specificity in terms of plans, no associated intent, good self-control, limited dysphoria/symptomatology, some risk factors present, and identifiable protective factors, including available and accessible social support.  PLAN OF CARE: Supportive approach/coping skills/relapse  prevention                              Detox/reassess and address the co morbidities  I certify that inpatient services furnished can reasonably be expected to improve the patient's condition.  Rachael Feerving A Aylee Littrell 01/17/2014, 5:44 PM

## 2014-01-17 NOTE — Progress Notes (Signed)
Case discussed, agree with plan 

## 2014-01-18 DIAGNOSIS — F1020 Alcohol dependence, uncomplicated: Principal | ICD-10-CM

## 2014-01-18 DIAGNOSIS — F1994 Other psychoactive substance use, unspecified with psychoactive substance-induced mood disorder: Secondary | ICD-10-CM

## 2014-01-18 NOTE — [facility] Group Notes (Signed)
[facility] Group Notes:  (Nursing/MHT/Case Management/Adjunct)  Date:  01/18/2014  Time:  9:52 AM  Type of Therapy:  Psychoeducational Skills  Participation Level:  Minimal  Participation Quality:  Appropriate and Attentive  Affect:  Appropriate  Cognitive:  Alert and Appropriate  Insight:  Appropriate  Engagement in Group:  Engaged  Modes of Intervention:  Clarification and Problem-solving  Summary of Progress/Problems: Morning Wellness; Minimal interaction but was receptive  Baker Hughes IncorporatedJennifer Hundley Jafari Mckillop 01/18/2014, 9:52 AM

## 2014-01-18 NOTE — Progress Notes (Signed)
Adult Psychoeducational Group Note  Date:  01/18/2014 Time:  11:10 PM  Group Topic/Focus:  Wrap-Up Group:   The focus of this group is to help patients review their daily goal of treatment and discuss progress on daily workbooks.  Participation Level:  Did Not Attend  Participation Quality:    Affect:    Cognitive:    Insight:   Engagement in Group:    Modes of Intervention:    Additional Comments:  Pt did not attend group.  Danielle RankinLequisha D Elida Harbin 01/18/2014, 11:10 PM

## 2014-01-18 NOTE — Progress Notes (Signed)
Recreation Therapy Notes  Animal-Assisted Activity/Therapy (AAA/T) Program Checklist/Progress Notes Patient Eligibility Criteria Checklist & Daily Group note for Rec Tx Intervention  Date: 04.16.2015 Time: 2:45pm Location: [address]   AAA/T Program Assumption of Risk Form signed by Patient/ or Parent Legal Guardian yes  Patient is free of allergies or sever asthma yes  Patient reports no fear of animals yes  Patient reports no history of cruelty to animals yes   Patient understands his/her participation is voluntary yes  Behavioral Response: Did not attend.    Gohan Collister L Alyx Gee, LRT/CTRS  Deryn Massengale L Tiaunna Buford 01/18/2014 4:24 PM 

## 2014-01-18 NOTE — Progress Notes (Signed)
Patient ID: Danielle Villanueva, female   DOB: 12-03-1950, 63 y.o.   MRN: [id number]   D: Patient had a flat affect on approach today. Reports her mood not well today. Not wanting to speak much this am but denies SI at present. Did attend morning groups this am with some participation in undersigns group. Went over medication this am and patient wrote down the information. A: Staff will monitor on q 15 minute checks, follow treatment plan, and give meds as ordered R: Cooperative on the unit this am.

## 2014-01-18 NOTE — Progress Notes (Signed)
D: Pt denies SI/HI/AVH. Pt is pleasant and cooperative. Pt wanted info on the [facility] program, pt stated she called them today, but the person she needed to speak to would not be there until tomorrow.   A: Pt was offered support and encouragement. Pt was given scheduled medications. Pt was encourage to attend groups. Q 15 minute checks were done for safety.   R:Pt attends groups and interacts well with peers and staff. Pt is taking medication.Pt receptive to treatment and safety maintained on unit. 

## 2014-01-18 NOTE — Progress Notes (Signed)
[facility]  01/18/2014 4:05 PM Danielle Villanueva  MRN:  [id number] Subjective:  Danielle Villanueva is having a hard time. A friend called her son and he was pretty clear in letting her know he did not want anything to do with her. Called the storage unit and if she does not come up with the money they are going to auction her belongings. She is trying to get into the [facility] program. States she will be lose almost everything she has if they would auction her belongings.   Diagnosis:   DSM5: Schizophrenia Disorders:  none Obsessive-Compulsive Disorders:  none Trauma-Stressor Disorders:  none Substance/Addictive Disorders:  Alcohol Related Disorder - Severe (303.90) Depressive Disorders:  Major Depressive Disorder - Moderate (296.22) Total Time spent with patient: 30 minutes  Axis I: Substance Induced Mood Disorder  ADL's:  Intact  Sleep: Poor  Appetite:  Fair  Suicidal Ideation:  Plan:  denies Intent:  denies Means:  denies Homicidal Ideation:  Plan:  denies Intent:  denies Means:  denies AEB (as evidenced by):  Psychiatric Specialty Exam: Physical Exam  Review of Systems  Constitutional: Positive for malaise/fatigue.  HENT: Negative.   Eyes: Negative.   Respiratory: Negative.   Cardiovascular: Negative.   Gastrointestinal: Negative.   Genitourinary: Negative.   Musculoskeletal: Negative.   Skin: Negative.   Neurological: Positive for weakness.  Endo/Heme/Allergies: Negative.   Psychiatric/Behavioral: Positive for depression and substance abuse. The patient is nervous/anxious.     Blood pressure 112/78, pulse 78, temperature 98.2 F (36.8 C), temperature source Oral, resp. rate 15, height 5\' 1"  (1.549 m), weight 48.081 kg (106 lb), SpO2 98.00%.Body mass index is 20.04 kg/(m^2).  General Appearance: Fairly Groomed  Patent attorneyye Contact::  Fair  Speech:  Clear and Coherent, Slow and not spontaneous  Volume:  Decreased  Mood:  Depressed  Affect:  Depressed  Thought Process:   Coherent and Goal Directed  Orientation:  Full (Time, Place, and Person)  Thought Content:  symptoms, worries, concerns  Suicidal Thoughts:  No  Homicidal Thoughts:  No  Memory:  Immediate;   Fair Recent;   Fair Remote;   Fair  Judgement:  Fair  Insight:  Present and Shallow  Psychomotor Activity:  Restlessness  Concentration:  Fair  Recall:  FiservFair  Fund of Knowledge:NA  Language: Fair  Akathisia:  No  Handed:    AIMS (if indicated):     Assets:  Desire for Improvement  Sleep:      Musculoskeletal: Strength & Muscle Tone: within normal limits Gait & Station: normal Patient leans: N/A  Current Medications: Current Facility-Administered Medications  Medication Dose Route Frequency Provider Last Rate Last Dose  . acetaminophen (TYLENOL) tablet 650 mg  650 mg Oral Q6H PRN Sanjuana KavaAgnes I Nwoko, NP      . alum & mag hydroxide-simeth (MAALOX/MYLANTA) 200-200-20 MG/5ML suspension 30 mL  30 mL Oral Q4H PRN Nanine MeansJamison Lord, NP      . buPROPion (WELLBUTRIN XL) 24 hr tablet 150 mg  150 mg Oral Daily Nanine MeansJamison Lord, NP   150 mg at 01/18/14 0816  . gabapentin (NEURONTIN) capsule 100 mg  100 mg Oral TID Nanine MeansJamison Lord, NP   100 mg at 01/18/14 1148  . hydrOXYzine (ATARAX/VISTARIL) tablet 25 mg  25 mg Oral Q6H PRN Sanjuana KavaAgnes I Nwoko, NP   25 mg at 01/16/14 2121  . ibuprofen (ADVIL,MOTRIN) tablet 600 mg  600 mg Oral Q6H PRN Beau FannyJohn C Withrow, FNP   600 mg at 01/17/14 2112  .  magnesium hydroxide (MILK OF MAGNESIA) suspension 30 mL  30 mL Oral Daily PRN Nanine MeansJamison Lord, NP      . mirtazapine (REMERON) tablet 15 mg  15 mg Oral QHS Nanine MeansJamison Lord, NP   15 mg at 01/17/14 2110  . multivitamin with minerals tablet 1 tablet  1 tablet Oral Daily Lavena BullionHeather W Baron, RD   1 tablet at 01/18/14 0816  . nicotine (NICODERM CQ - dosed in mg/24 hours) patch 21 mg  21 mg Transdermal Daily Nanine MeansJamison Lord, NP   21 mg at 01/18/14 0816  . ondansetron (ZOFRAN-ODT) disintegrating tablet 4 mg  4 mg Oral Q8H PRN Beau FannyJohn C Withrow, FNP      . traZODone  (DESYREL) tablet 50 mg  50 mg Oral QHS PRN Nanine MeansJamison Lord, NP        Lab Results: No results found for this or any previous visit (from the past 48 hour(s)).  Physical Findings: AIMS: Facial and Oral Movements Muscles of Facial Expression: None, normal Lips and Perioral Area: None, normal Jaw: None, normal Tongue: None, normal,Extremity Movements Upper (arms, wrists, hands, fingers): None, normal Lower (legs, knees, ankles, toes): None, normal, Trunk Movements Neck, shoulders, hips: None, normal, Overall Severity Severity of abnormal movements (highest score from questions above): None, normal Incapacitation due to abnormal movements: None, normal Patient's awareness of abnormal movements (rate only patient's report): No Awareness, Dental Status Current problems with teeth and/or dentures?: No Does patient usually wear dentures?: No  CIWA:  CIWA-Ar Total: 7 COWS:     Treatment Plan Summary: Daily contact with patient to assess and evaluate symptoms and progress in treatment Medication management  Plan: Supportive approach/coping skills/relape prevention           Continue to look at options to help her psychosocial situation that is causing so much stress. Facilitate applying to [facility]  Medical Decision Making Problem Points:  Review of psycho-social stressors (1) Data Points:  Review of medication regiment & side effects (2)  I certify that inpatient services furnished can reasonably be expected to improve the patient's condition.   Rachael Feerving A Mynor Witkop 01/18/2014, 4:05 PM

## 2014-01-18 NOTE — [facility] Group Notes (Signed)
[facility] LCSW Group Therapy  01/18/2014 2:41 PM  Type of Therapy:  Group Therapy  Participation Level:  Active  Participation Quality:  Attentive  Affect:  Appropriate  Cognitive:  Alert and Oriented  Insight:  Improving  Engagement in Therapy:  Improving  Modes of Intervention:  Confrontation, Discussion, Education, Exploration, Problem-solving, Rapport Building, Socialization and Support  Summary of Progress/Problems:  Finding Balance in Life. Today's group focused on defining balance in one's own words, identifying things that can knock one off balance, and exploring healthy ways to maintain balance in life. Group members were asked to provide an example of a time when they felt off balance, describe how they handled that situation,and process healthier ways to regain balance in the future. Group members were asked to share the most important tool for maintaining [facility] and how they plan to apply this method after discharge. Danielle Villanueva was attentive and engaged throughout today's therapy group. She shared that she plans to read the Bible "and have bible study daily" in order to spiritually get back on track. Danielle Villanueva stated that in the past, she would read four hours daily in the morning and this helped her gain mental clarity for the rest of her day. Danielle Villanueva continues to show progress in the group setting and improving insight AEB her ability to process how making time for herself and focusing on her sobriety can help her reestablish a sense of balance in other areas of her life.    Danielle Villanueva LCSWA  01/18/2014, 2:41 PM

## 2014-01-18 NOTE — [facility] Suicide Risk Assessment (Signed)
[facility] INPATIENT:  Family/Significant Other Suicide Prevention Education  Suicide Prevention Education:  Contact Attempts:  has been identified by the patient as the family member/significant other with whom the patient will be residing, and identified as the person(s) who will aid the patient in the event of a mental health crisis.  With written consent from the patient, two attempts were made to provide suicide prevention education, prior to and/or following the patient's discharge.  We were unsuccessful in providing suicide prevention education.  A suicide education pamphlet was given to the patient to share with family/significant other.  Date and time of first attempt: 10:45AM 01/18/14  Date and time of second attempt: 01/18/14 3:15PM (voicemail left requesting call back)   The Sherwin-WilliamsHeather Smart LCSWA  01/18/2014, 3:14 PM

## 2014-01-18 NOTE — Clinical Social Work Note (Addendum)
Referrals still pending for both [facility] and [facility]. CSW left two voicemails for Danielle Villanueva at [facility] [phone]) requesting call back to discuss referral and check bed availability for women. Pt is on waitlist for [facility] per Danielle Villanueva (Intake coordinator).  Danielle Villanueva, LCSWA 01/18/2014 1:04 PM   Pt requested [facility] list-CSW provided this to pt and encouraged her to begin calling.  The Sherwin-WilliamsHeather Villanueva, LCSWA 01/18/2014 2:44 PM

## 2014-01-19 NOTE — Progress Notes (Signed)
D. Pt has been up and active in milieu, attending and participating in various activities. Pt reports that she would like to find a job, get an apartment and get back on her feet. Pt reports that she works in [employer], pt also spoke about the need to get to rehab facility before she can do any of this. Pt still appears flat and depressed.  A. Support and encouragement provided, medication education given. R. Pt verbalized understanding, safety maintained.

## 2014-01-19 NOTE — [facility] Group Notes (Signed)
[facility]   01/19/2014 8:45 AM  Participation Quality:  Alert, Appropriate and Oriented  Mood/Affect:  Anxious  Depression Rating:  7  Anxiety Rating:  7  Thoughts of Suicide:  Pt denies SI/HI  Will you contract for safety?   Yes  Current AVH:  Pt denies  Plan for Discharge/Comments:  Pt attended discharge planning group and actively participated in group.  CSW provided pt with today's workbook.  Pt reports working on getting into [facility] or [facility] for further inpatient treatment.  CSW will monitor referrals.  No further needs voiced by pt at this time.    Transportation Means: Pt reports access to transportation  Supports: No supports mentioned at this time  Reyes IvanChelsea Horton, LCSW 01/19/2014 9:52 AM

## 2014-01-19 NOTE — Progress Notes (Signed)
Assumed care of patient at 2300. Pt has rested throughout the night without complaint. No distress observed. Level III obs in place for safety and pt remains safe. Levell JulyMarian Eakes Zachery ConchFriedman

## 2014-01-19 NOTE — Progress Notes (Signed)
Adult Psychoeducational Group Note  Date:  01/19/2014 Time:  1:55 PM  Group Topic/Focus:  Early Warning Signs:   The focus of this group is to help patients identify signs or symptoms they exhibit before slipping into an unhealthy state or crisis.  Participation Level:  Active  Participation Quality:  Attentive and Supportive  Affect:  Appropriate  Cognitive:  Appropriate  Insight: Appropriate  Engagement in Group:  Engaged  Modes of Intervention:  Discussion and Education  Additional Comments:  Pts discussed early warning signs that lead up to their relapse. Pt stated she does not know what her early warning signs are because this is her first time in treatment. Pt stated she would like to spend more time with her granddaughter, and that she likes doing crafts to keep herself occupied.  Caswell CorwinDana C Carle Fenech 01/19/2014, 1:55 PM

## 2014-01-19 NOTE — Progress Notes (Signed)
[facility] Group Notes:  (Nursing/MHT/Case Management/Adjunct)  Date:  01/19/2014  Time:  3:19 PM  Type of Therapy:  Therapeutic Activity  Participation Level:  Active  Participation Quality:  Appropriate  Affect:  Appropriate  Cognitive:  Appropriate  Insight:  Appropriate  Engagement in Group:  Engaged and Supportive  Modes of Intervention:  Activity  Summary of Progress/Problems: Pts played a game using the Therapeutic Activity Ball.  Danielle Villanueva C Berdena Cisek 01/19/2014, 3:19 PM 

## 2014-01-19 NOTE — Progress Notes (Signed)
Patient ID: Danielle Villanueva, female   DOB: September 14, 1951, 63 y.o.   MRN: [id number] 01-19-14 @ 1530 report given to brooks m,rn.Marland Kitchen.sbw

## 2014-01-19 NOTE — Clinical Social Work Note (Signed)
Pt was to interview with [facility] GraterMaggie for [facility] today but states that Seward GraterMaggie was out sick today and will have to call to interview on Monday.    Danielle IvanChelsea Horton, LCSW 01/19/2014  2:59 PM

## 2014-01-19 NOTE — Progress Notes (Signed)
Pt attended AA group this evening.  

## 2014-01-19 NOTE — Tx Team (Signed)
Interdisciplinary Treatment Plan Update (Adult)  Date: 01/19/2014  Time Reviewed:  9:45 AM  Progress in Treatment: Attending groups: Yes Participating in groups:  Yes Taking medication as prescribed:  Yes Tolerating medication:  Yes Family/Significant othe contact made: No, attempts were made Patient understands diagnosis:  Yes Discussing patient identified problems/goals with staff:  Yes Medical problems stabilized or resolved:  Yes Denies suicidal/homicidal ideation: Yes Issues/concerns per patient self-inventory:  Yes Other:  New problem(s) identified: N/A  Discharge Plan or Barriers: Pt is working on getting into [facility] and [facility] for further inpatient treatment.    Reason for Continuation of Hospitalization: Anxiety Depression Medication Stabilization Detox  Comments: N/A  Estimated length of stay: 3-5 days  For review of initial/current patient goals, please see plan of care.  Attendees: Patient:     Family:     Physician:  Dr. Dub MikesLugo 01/19/2014 10:48 AM   Nursing:   Carroll SageBrooks Nichols, RN 01/19/2014 10:48 AM   Clinical Social Worker:  Reyes Ivanhelsea Horton, LCSW 01/19/2014 10:48 AM   Other: Onnie BoerJennifer Clark, RN case manager 01/19/2014 10:48 AM   Other:  Serena ColonelAggie Nwoko, NP 01/19/2014 10:48 AM   Other:     Other:     Other:    Other:    Other:    Other:    Other:    Other:     Scribe for Treatment Team:   Carmina MillerHorton, Brenten Janney Nicole, 01/19/2014 , 10:48 AM

## 2014-01-19 NOTE — Progress Notes (Signed)
Patient ID: Danielle Villanueva, female   DOB: November 23, 1950, 63 y.o.   MRN: [id number] [facility]  01/19/2014 2:04 PM Danielle Villanueva  MRN:  [id number]  Subjective:  Danielle Villanueva is still having a hard time. She says today, "I'm fine, that's about it"  O: Allyiah continue to present sad facial expression. She is worried about her belongings in the storage unit, she says she is going to lose her things if she does not come up with the payment.  She is hoping on getting into [facility]. She denies any SIHI, AVH  Diagnosis:   DSM5: Schizophrenia Disorders:  none Obsessive-Compulsive Disorders:  none Trauma-Stressor Disorders:  none Substance/Addictive Disorders:  Alcohol Related Disorder - Severe (303.90) Depressive Disorders:  Major Depressive Disorder - Moderate (296.22) Total Time spent with patient: 30 minutes  Axis I: Substance Induced Mood Disorder  ADL's:  Intact  Sleep: Poor  Appetite:  Fair  Suicidal Ideation:  Plan:  denies Intent:  denies Means:  denies Homicidal Ideation:  Plan:  denies Intent:  denies Means:  denies AEB (as evidenced by):  Psychiatric Specialty Exam: Physical Exam  Review of Systems  Constitutional: Positive for malaise/fatigue.  HENT: Negative.   Eyes: Negative.   Respiratory: Negative.   Cardiovascular: Negative.   Gastrointestinal: Negative.   Genitourinary: Negative.   Musculoskeletal: Negative.   Skin: Negative.   Neurological: Positive for weakness.  Endo/Heme/Allergies: Negative.   Psychiatric/Behavioral: Positive for depression and substance abuse. The patient is nervous/anxious.     Blood pressure 113/76, pulse 65, temperature 98 F (36.7 C), temperature source Oral, resp. rate 16, height 5\' 1"  (1.549 m), weight 48.081 kg (106 lb), SpO2 98.00%.Body mass index is 20.04 kg/(m^2).  General Appearance: Fairly Groomed  Patent attorneyye Contact::  Fair  Speech:  Clear and Coherent, Slow and not spontaneous  Volume:  Decreased  Mood:  Depressed  Affect:   Depressed  Thought Process:  Coherent and Goal Directed  Orientation:  Full (Time, Place, and Person)  Thought Content:  symptoms, worries, concerns, rumintaions  Suicidal Thoughts:  No  Homicidal Thoughts:  No  Memory:  Immediate;   Fair Recent;   Fair Remote;   Fair  Judgement:  Fair  Insight:  Present and Shallow  Psychomotor Activity:  Restlessness  Concentration:  Fair  Recall:  FiservFair  Fund of Knowledge:NA  Language: Fair  Akathisia:  No  Handed:    AIMS (if indicated):     Assets:  Desire for Improvement  Sleep:  Number of Hours: 5.25   Musculoskeletal: Strength & Muscle Tone: within normal limits Gait & Station: normal Patient leans: N/A  Current Medications: Current Facility-Administered Medications  Medication Dose Route Frequency Provider Last Rate Last Dose  . acetaminophen (TYLENOL) tablet 650 mg  650 mg Oral Q6H PRN Sanjuana KavaAgnes I Nwoko, NP   650 mg at 01/18/14 2118  . alum & mag hydroxide-simeth (MAALOX/MYLANTA) 200-200-20 MG/5ML suspension 30 mL  30 mL Oral Q4H PRN Nanine MeansJamison Lord, NP      . buPROPion (WELLBUTRIN XL) 24 hr tablet 150 mg  150 mg Oral Daily Nanine MeansJamison Lord, NP   150 mg at 01/19/14 0816  . gabapentin (NEURONTIN) capsule 100 mg  100 mg Oral TID Nanine MeansJamison Lord, NP   100 mg at 01/19/14 1200  . hydrOXYzine (ATARAX/VISTARIL) tablet 25 mg  25 mg Oral Q6H PRN Sanjuana KavaAgnes I Nwoko, NP   25 mg at 01/18/14 2117  . ibuprofen (ADVIL,MOTRIN) tablet 600 mg  600 mg Oral Q6H PRN Jonny RuizJohn  Chipper Herb Withrow, FNP   600 mg at 01/19/14 0819  . magnesium hydroxide (MILK OF MAGNESIA) suspension 30 mL  30 mL Oral Daily PRN Nanine MeansJamison Lord, NP      . mirtazapine (REMERON) tablet 15 mg  15 mg Oral QHS Nanine MeansJamison Lord, NP   15 mg at 01/18/14 2117  . multivitamin with minerals tablet 1 tablet  1 tablet Oral Daily Lavena BullionHeather W Baron, RD   1 tablet at 01/19/14 0816  . nicotine (NICODERM CQ - dosed in mg/24 hours) patch 21 mg  21 mg Transdermal Daily Nanine MeansJamison Lord, NP   21 mg at 01/19/14 0816  . ondansetron (ZOFRAN-ODT)  disintegrating tablet 4 mg  4 mg Oral Q8H PRN Beau FannyJohn C Withrow, FNP      . traZODone (DESYREL) tablet 50 mg  50 mg Oral QHS PRN Nanine MeansJamison Lord, NP        Lab Results: No results found for this or any previous visit (from the past 48 hour(s)).  Physical Findings: AIMS: Facial and Oral Movements Muscles of Facial Expression: None, normal Lips and Perioral Area: None, normal Jaw: None, normal Tongue: None, normal,Extremity Movements Upper (arms, wrists, hands, fingers): None, normal Lower (legs, knees, ankles, toes): None, normal, Trunk Movements Neck, shoulders, hips: None, normal, Overall Severity Severity of abnormal movements (highest score from questions above): None, normal Incapacitation due to abnormal movements: None, normal Patient's awareness of abnormal movements (rate only patient's report): No Awareness, Dental Status Current problems with teeth and/or dentures?: No Does patient usually wear dentures?: No  CIWA:  CIWA-Ar Total: 7 COWS:     Treatment Plan Summary: Daily contact with patient to assess and evaluate symptoms and progress in treatment Medication management  Plan: Supportive approach/coping skills/relape prevention Continue to look at options to help her psychosocial situation that is causing so much stress. Facilitate applying to [facility] after discharge. Continue current plan of care.  Medical Decision Making Problem Points:  Review of psycho-social stressors (1) Data Points:  Review of medication regiment & side effects (2)  I certify that inpatient services furnished can reasonably be expected to improve the patient's condition.   Sanjuana KavaAgnes I Nwoko, PMHN, FNP-BC 01/19/2014, 2:04 PM Personally evaluated the patient and agree with assessment and plan Madie RenoIrving A. Maurisio Ruddy,M.D.

## 2014-01-19 NOTE — [facility] Group Notes (Signed)
[facility] LCSW Group Therapy  01/19/2014  1:15 PM   Type of Therapy:  Group Therapy  Participation Level:  Active  Participation Quality:  Attentive, Sharing and Supportive  Affect:  Depressed and Flat  Cognitive:  Alert and Oriented  Insight:  Developing/Improving and Engaged  Engagement in Therapy:  Developing/Improving and Engaged  Modes of Intervention:  Clarification, Confrontation, Discussion, Education, Exploration, Limit-setting, Orientation, Problem-solving, Rapport Building, Dance movement psychotherapisteality Testing, Socialization and Support  Summary of Progress/Problems: The topic for today was feelings about relapse.  Pt discussed what relapse prevention is to them and identified triggers that they are on the path to relapse.  Pt processed their feeling towards relapse and was able to relate to peers.  Pt discussed coping skills that can be used for relapse prevention.  Pt discussed fear of relapse being both good and bad and was able to give examples of why.  Pt did not share personally but was interactive with peers in their process, actively participated and was engaged in group discussion.    Reyes IvanChelsea Horton, LCSW 01/19/2014  2:14 PM

## 2014-01-20 NOTE — Progress Notes (Signed)
Patient ID: Danielle Villanueva, female   DOB: 1951/04/01, 63 y.o.   MRN: [id number] Patient ID: Danielle Villanueva, female   DOB: 1951/04/01, 63 y.o.   MRN: [id number] [facility]  01/20/2014 2:31 PM Danielle Villanueva  MRN:  [id number]  Subjective:  Danielle Villanueva reports, "I'm here, I feel fine. I will come to you if I ned you"  O: Danielle Villanueva continues to present sad facial expression. She is self isolating. Not participating in most groups sessions. However, she does work on puzzles by herself.  Diagnosis:   DSM5: Schizophrenia Disorders:  none Obsessive-Compulsive Disorders:  none Trauma-Stressor Disorders:  none Substance/Addictive Disorders:  Alcohol Related Disorder - Severe (303.90) Depressive Disorders:  Major Depressive Disorder - Moderate (296.22) Total Time spent with patient: 30 minutes  Axis I: Substance Induced Mood Disorder  ADL's:  Intact  Sleep: Poor  Appetite:  Fair  Suicidal Ideation:  Plan:  denies Intent:  denies Means:  denies Homicidal Ideation:  Plan:  denies Intent:  denies Means:  denies AEB (as evidenced by):  Psychiatric Specialty Exam: Physical Exam  Review of Systems  Constitutional: Positive for malaise/fatigue.  HENT: Negative.   Eyes: Negative.   Respiratory: Negative.   Cardiovascular: Negative.   Gastrointestinal: Negative.   Genitourinary: Negative.   Musculoskeletal: Negative.   Skin: Negative.   Neurological: Positive for weakness.  Endo/Heme/Allergies: Negative.   Psychiatric/Behavioral: Positive for depression and substance abuse. The patient is nervous/anxious.     Blood pressure 113/76, pulse 65, temperature 98 F (36.7 C), temperature source Oral, resp. rate 16, height 5\' 1"  (1.549 m), weight 48.081 kg (106 lb), SpO2 98.00%.Body mass index is 20.04 kg/(m^2).  General Appearance: Fairly Groomed  Patent attorneyye Contact::  Fair  Speech:  Clear and Coherent, Slow and not spontaneous  Volume:  Decreased  Mood:  Depressed  Affect:  Depressed  Thought  Process:  Coherent and Goal Directed  Orientation:  Full (Time, Place, and Person)  Thought Content:  symptoms, worries, concerns, rumintaions  Suicidal Thoughts:  No  Homicidal Thoughts:  No  Memory:  Immediate;   Fair Recent;   Fair Remote;   Fair  Judgement:  Fair  Insight:  Present and Shallow  Psychomotor Activity:  Restlessness  Concentration:  Fair  Recall:  FiservFair  Fund of Knowledge:NA  Language: Fair  Akathisia:  No  Handed:    AIMS (if indicated):     Assets:  Desire for Improvement  Sleep:  Number of Hours: 5.75   Musculoskeletal: Strength & Muscle Tone: within normal limits Gait & Station: normal Patient leans: N/A  Current Medications: Current Facility-Administered Medications  Medication Dose Route Frequency Provider Last Rate Last Dose  . acetaminophen (TYLENOL) tablet 650 mg  650 mg Oral Q6H PRN Sanjuana KavaAgnes I Viki Carrera, NP   650 mg at 01/19/14 1821  . alum & mag hydroxide-simeth (MAALOX/MYLANTA) 200-200-20 MG/5ML suspension 30 mL  30 mL Oral Q4H PRN Nanine MeansJamison Lord, NP      . buPROPion (WELLBUTRIN XL) 24 hr tablet 150 mg  150 mg Oral Daily Nanine MeansJamison Lord, NP   150 mg at 01/20/14 0750  . gabapentin (NEURONTIN) capsule 100 mg  100 mg Oral TID Nanine MeansJamison Lord, NP   100 mg at 01/20/14 1301  . hydrOXYzine (ATARAX/VISTARIL) tablet 25 mg  25 mg Oral Q6H PRN Sanjuana KavaAgnes I Wilbur Oakland, NP   25 mg at 01/18/14 2117  . ibuprofen (ADVIL,MOTRIN) tablet 600 mg  600 mg Oral Q6H PRN Beau FannyJohn C Withrow, FNP   600  mg at 01/20/14 0753  . magnesium hydroxide (MILK OF MAGNESIA) suspension 30 mL  30 mL Oral Daily PRN Nanine MeansJamison Lord, NP      . mirtazapine (REMERON) tablet 15 mg  15 mg Oral QHS Nanine MeansJamison Lord, NP   15 mg at 01/19/14 2138  . multivitamin with minerals tablet 1 tablet  1 tablet Oral Daily Lavena BullionHeather W Baron, RD   1 tablet at 01/20/14 0750  . nicotine (NICODERM CQ - dosed in mg/24 hours) patch 21 mg  21 mg Transdermal Daily Nanine MeansJamison Lord, NP   21 mg at 01/20/14 0800  . ondansetron (ZOFRAN-ODT) disintegrating tablet  4 mg  4 mg Oral Q8H PRN Beau FannyJohn C Withrow, FNP      . traZODone (DESYREL) tablet 50 mg  50 mg Oral QHS PRN Nanine MeansJamison Lord, NP        Lab Results: No results found for this or any previous visit (from the past 48 hour(s)).  Physical Findings: AIMS: Facial and Oral Movements Muscles of Facial Expression: None, normal Lips and Perioral Area: None, normal Jaw: None, normal Tongue: None, normal,Extremity Movements Upper (arms, wrists, hands, fingers): None, normal Lower (legs, knees, ankles, toes): None, normal, Trunk Movements Neck, shoulders, hips: None, normal, Overall Severity Severity of abnormal movements (highest score from questions above): None, normal Incapacitation due to abnormal movements: None, normal Patient's awareness of abnormal movements (rate only patient's report): No Awareness, Dental Status Current problems with teeth and/or dentures?: No Does patient usually wear dentures?: No  CIWA:  CIWA-Ar Total: 7 COWS:     Treatment Plan Summary: Daily contact with patient to assess and evaluate symptoms and progress in treatment Medication management  Plan: Supportive approach/coping skills/relape prevention Continue to look at options to help her psychosocial situation that is causing so much stress. Facilitate applying to [facility] after discharge. Continue current plan of care.  Medical Decision Making Problem Points:  Review of psycho-social stressors (1) Data Points:  Review of medication regiment & side effects (2)  I certify that inpatient services furnished can reasonably be expected to improve the patient's condition.   Sanjuana KavaAgnes I Valin Massie, PMHN, FNP-BC 01/20/2014, 2:31 PM

## 2014-01-20 NOTE — Progress Notes (Signed)
Patient ID: Danielle Villanueva, female   DOB: October 17, 1950, 63 y.o.   MRN: [id number] D: patient is up in the milieu interacting with staff and peers.  She rates her depressive symptoms as a 5/6 and hopelessness as a 7.  She is attending groups and participating in her treatment. She denies any SI/HI/AVH.  Her plan is to interview with [facility] on Monday and secure a bed.  She continues to complain of tremors, cravings and chills.  A: continue to monitor medication management and MD orders.  Safety checks completed every 15 minutes per protocol.  R: patient is receptive to staff; her behavior is appropriate.

## 2014-01-20 NOTE — Progress Notes (Signed)
Patient ID: Danielle Villanueva, female   DOB: 05-Dec-1950, 63 y.o.   MRN: [id number] Pt resting in bed with eyes closed.  RR equal an unlabored.  Fifteen minute checks in progress. Pt safe on unit.

## 2014-01-20 NOTE — Progress Notes (Signed)
[facility] Group Notes:  (Nursing/MHT/Case Management/Adjunct)  Date:  01/20/2014  Time:  5:20 PM  Type of Therapy:  Psychoeducational Skills  Participation Level:  Active  Participation Quality:  Appropriate and Attentive  Affect:  Appropriate  Cognitive:  Appropriate  Insight:  Appropriate  Engagement in Group:  Engaged and Supportive  Modes of Intervention:  Activity  Summary of Progress/Problems: Pts played coping skills Pictionary.  Lyric Hoar C Oktober Glazer 01/20/2014, 5:20 PM 

## 2014-01-20 NOTE — Progress Notes (Signed)
Patient did attend the evening speaker AA meeting.  

## 2014-01-20 NOTE — [facility] Group Notes (Signed)
[facility] Group Notes:  (Nursing/MHT/Case Management/Adjunct)  Date:  01/20/2014  Time:  11:12 AM  Type of Therapy:  Psychoeducational Skills  Participation Level:  Active  Participation Quality:  Appropriate  Affect:  Appropriate  Cognitive:  Appropriate  Insight:  Appropriate  Engagement in Group:  Engaged  Modes of Intervention:  Discussion  Summary of Progress/Problems: Pt did attend self inventory group, pt reported that she was negative SI/HI, no AH/VH noted. Pt rated her depression as a 7, and her helplessness/hopelessness as a 7.      Danielle Villanueva Danielle Villanueva 01/20/2014, 11:12 AM

## 2014-01-20 NOTE — [facility] Group Notes (Signed)
[facility] LCSW Group Therapy  01/20/2014 11:05 AM  Type of Therapy:  Group Therapy  Participation Level:  Minimal  Participation Quality:  Attentive  Affect:  Depressed and Flat  Cognitive:  Appropriate and Oriented  Insight:  Limited  Engagement in Therapy:  Lacking  Modes of Intervention:  Discussion, Exploration, Problem-solving, Rapport Building, Socialization and Support  Summary of Progress/Problems: The main focus of today's process group was to identify the patient's current support system and decide on other supports that can be put in place. An emphasis was placed on using counselor, doctor, therapy groups, 12-step groups, and problem-specific support groups to expand supports, as well as doing something different than has been done before.   [name] was observed to provide minimal engagement within group as she quietly completed a puzzle throughout the discussion. She utilized body language to convey her agreement with previous statements of peers who reported their experiences with allowing negative supports to affect their daily lives. Jylian refrained from participating verbally within group; however she was observed to be attentive and receptive to the discussion of gaining positive supports to continue wellness beyond discharge.   Haskel KhanGregory C Pickett Jr. 01/20/2014, 11:05 AM

## 2014-01-20 NOTE — Progress Notes (Signed)
Pt has been isolative to self choosing to work on puzzles away from the other patients in the dayroom. Minimal interaction with peers. Arguing with roommate over temperature control in their room. Affect anxious with irritable mood. Pt offered support. Attempted to give pt remeron per order but pt refused. No needs expressed. Denies pain, physical problems. Denies SI/HI and remains safe. Levell JulyMarian Eakes Zachery ConchFriedman

## 2014-01-21 NOTE — [facility] Group Notes (Signed)
[facility] Group Notes:  (Nursing/MHT/Case Management/Adjunct)  Date:  01/21/2014  Time:  11:48 AM  Type of Therapy:  Psychoeducational Skills  Participation Level:  Active  Participation Quality:  Appropriate  Affect:  Appropriate  Cognitive:  Appropriate  Insight:  Appropriate  Engagement in Group:  Engaged  Modes of Intervention:  Discussion  Summary of Progress/Problems: Pt did attend self inventory group, pt reported that she was negative SI/HI, no AH/VH noted. Pt rated her depression as a 4, and his helplessness/hopelessness as a 4.      Dionisia Pacholski Shanta Leevon Upperman 01/21/2014, 11:48 AM

## 2014-01-21 NOTE — Progress Notes (Signed)
Pt did not attend group this afternoon.   

## 2014-01-21 NOTE — Progress Notes (Signed)
Pt has been in the medication room putting a puzzle together.  She reports her sleep as poor she stated,"my roommate has the air too cold for me. I feel like I am getting bronchitis" She is reporting all her depression, hopelessness and anxiety a 7 on her self-inventory.  Arrangements are being made to move pt's around on the unit to see if better matches will work out.  She is saying she still don't know where she plans to go at discharged.  She stated,"I am homeless and not sure where I am going yet" She denies any S/H ideation or A/V/H.

## 2014-01-21 NOTE — Progress Notes (Signed)
Patient ID: Danielle Stallseggy F Navarro, female   DOB: Sep 14, 1951, 63 y.o.   MRN: [id number] Patient ID: Danielle Stallseggy F Matheny, female   DOB: Sep 14, 1951, 63 y.o.   MRN: [id number] Patient ID: Danielle Stallseggy F Loudenslager, female   DOB: Sep 14, 1951, 63 y.o.   MRN: [id number] [facility]  01/21/2014 2:24 PM Danielle Villanueva  MRN:  [id number]  Subjective:  Mary reports, "I don't feel like talking, I'm just here"  O: Anabia continues to present sad facial expression. She is self isolating. Not participating in most groups sessions. She is currently in disagreement with her room-mate over their room temperature. She has been given the option to move to another room, [name] declines, saying she is now in good terms with her room-mate.  Diagnosis:   DSM5: Schizophrenia Disorders:  none Obsessive-Compulsive Disorders:  none Trauma-Stressor Disorders:  none Substance/Addictive Disorders:  Alcohol Related Disorder - Severe (303.90) Depressive Disorders:  Major Depressive Disorder - Moderate (296.22) Total Time spent with patient: 30 minutes  Axis I: Substance Induced Mood Disorder  ADL's:  Intact  Sleep: Poor  Appetite:  Fair  Suicidal Ideation:  Plan:  denies Intent:  denies Means:  denies Homicidal Ideation:  Plan:  denies Intent:  denies Means:  denies AEB (as evidenced by):  Psychiatric Specialty Exam: Physical Exam  Review of Systems  Constitutional: Positive for malaise/fatigue.  HENT: Negative.   Eyes: Negative.   Respiratory: Negative.   Cardiovascular: Negative.   Gastrointestinal: Negative.   Genitourinary: Negative.   Musculoskeletal: Negative.   Skin: Negative.   Neurological: Positive for weakness.  Endo/Heme/Allergies: Negative.   Psychiatric/Behavioral: Positive for depression and substance abuse. The patient is nervous/anxious.     Blood pressure 105/67, pulse 68, temperature 97.4 F (36.3 C), temperature source Oral, resp. rate 17, height 5\' 1"  (1.549 m), weight 48.081 kg (106 lb), SpO2  98.00%.Body mass index is 20.04 kg/(m^2).  General Appearance: Fairly Groomed  Patent attorneyye Contact::  Fair  Speech:  Clear and Coherent, Slow and not spontaneous  Volume:  Decreased  Mood:  Depressed  Affect:  Depressed  Thought Process:  Coherent and Goal Directed  Orientation:  Full (Time, Place, and Person)  Thought Content:  symptoms, worries, concerns, rumintaions  Suicidal Thoughts:  No  Homicidal Thoughts:  No  Memory:  Immediate;   Fair Recent;   Fair Remote;   Fair  Judgement:  Fair  Insight:  Present and Shallow  Psychomotor Activity:  Restlessness  Concentration:  Fair  Recall:  FiservFair  Fund of Knowledge:NA  Language: Fair  Akathisia:  No  Handed:    AIMS (if indicated):     Assets:  Desire for Improvement  Sleep:  Number of Hours: 5.75   Musculoskeletal: Strength & Muscle Tone: within normal limits Gait & Station: normal Patient leans: N/A  Current Medications: Current Facility-Administered Medications  Medication Dose Route Frequency Provider Last Rate Last Dose  . acetaminophen (TYLENOL) tablet 650 mg  650 mg Oral Q6H PRN Sanjuana KavaAgnes I Olena Willy, NP   650 mg at 01/19/14 1821  . alum & mag hydroxide-simeth (MAALOX/MYLANTA) 200-200-20 MG/5ML suspension 30 mL  30 mL Oral Q4H PRN Nanine MeansJamison Lord, NP      . buPROPion (WELLBUTRIN XL) 24 hr tablet 150 mg  150 mg Oral Daily Nanine MeansJamison Lord, NP   150 mg at 01/21/14 0810  . gabapentin (NEURONTIN) capsule 100 mg  100 mg Oral TID Nanine MeansJamison Lord, NP   100 mg at 01/21/14 1156  . hydrOXYzine (ATARAX/VISTARIL) tablet 25 mg  25 mg Oral Q6H PRN Sanjuana KavaAgnes I Loyd Salvador, NP   25 mg at 01/18/14 2117  . ibuprofen (ADVIL,MOTRIN) tablet 600 mg  600 mg Oral Q6H PRN Beau FannyJohn C Withrow, FNP   600 mg at 01/21/14 1257  . magnesium hydroxide (MILK OF MAGNESIA) suspension 30 mL  30 mL Oral Daily PRN Nanine MeansJamison Lord, NP      . mirtazapine (REMERON) tablet 15 mg  15 mg Oral QHS Nanine MeansJamison Lord, NP   15 mg at 01/19/14 2138  . multivitamin with minerals tablet 1 tablet  1 tablet Oral Daily  Lavena BullionHeather W Baron, RD   1 tablet at 01/21/14 0810  . nicotine (NICODERM CQ - dosed in mg/24 hours) patch 21 mg  21 mg Transdermal Daily Nanine MeansJamison Lord, NP   21 mg at 01/21/14 0814  . ondansetron (ZOFRAN-ODT) disintegrating tablet 4 mg  4 mg Oral Q8H PRN Beau FannyJohn C Withrow, FNP      . traZODone (DESYREL) tablet 50 mg  50 mg Oral QHS PRN Nanine MeansJamison Lord, NP        Lab Results: No results found for this or any previous visit (from the past 48 hour(s)).  Physical Findings: AIMS: Facial and Oral Movements Muscles of Facial Expression: None, normal Lips and Perioral Area: None, normal Jaw: None, normal Tongue: None, normal,Extremity Movements Upper (arms, wrists, hands, fingers): None, normal Lower (legs, knees, ankles, toes): None, normal, Trunk Movements Neck, shoulders, hips: None, normal, Overall Severity Severity of abnormal movements (highest score from questions above): None, normal Incapacitation due to abnormal movements: None, normal Patient's awareness of abnormal movements (rate only patient's report): No Awareness, Dental Status Current problems with teeth and/or dentures?: No Does patient usually wear dentures?: No  CIWA:  CIWA-Ar Total: 7 COWS:     Treatment Plan Summary: Daily contact with patient to assess and evaluate symptoms and progress in treatment Medication management  Plan: Supportive approach/coping skills/relape prevention Continue to look at options to help her psychosocial situation that is causing so much stress. Facilitate applying to [facility] after discharge. Continue current plan of care.  Medical Decision Making Problem Points:  Review of psycho-social stressors (1) Data Points:  Review of medication regiment & side effects (2)  I certify that inpatient services furnished can reasonably be expected to improve the patient's condition.   Sanjuana KavaAgnes I Rickie Gutierres, PMHN, FNP-BC 01/21/2014, 2:24 PM

## 2014-01-21 NOTE — Progress Notes (Signed)
Above note was reviewed. Concur with above assessment and plan.  Mikalia Fessel P Catina Nuss, MD 

## 2014-01-21 NOTE — Progress Notes (Signed)
Pt complaining of cold type symptoms. States chest hurts when she coughs and she reports a headache both 7/10. Pt given cepacol, ibuprofen prn. Encouraged her to speak with provider when seen today. Pt verbalized understanding. Levell JulyMarian Eakes Zachery ConchFriedman

## 2014-01-21 NOTE — Progress Notes (Signed)
Pt at med window asking for something for pain. Reports her chest and head still hurt as she believes she has a chest cold. Pain rated a 10/10. Asked pt if she shared her complaints with the provider who saw her today and pt said she did however provider's note reflects patient refused interview. She remains irritable in affect and mood. Medicated with ibuprofen 600mg  and will reassess in 1 hour. Attempted support but pt resistant. Continues to work on puzzles in med room. Denies SI/HI and remains safe. Levell JulyMarian Eakes Zachery ConchFriedman

## 2014-01-21 NOTE — Progress Notes (Signed)
[facility] Group Notes:  (Nursing/MHT/Case Management/Adjunct)  Date:  01/21/2014  Time:  4:38 PM  Type of Therapy:  Therapeutic Activity  Participation Level:  Active  Participation Quality:  Appropriate, Attentive and Supportive  Affect:  Appropriate  Cognitive:  Appropriate  Insight:  Appropriate  Engagement in Group:  Engaged and Supportive  Modes of Intervention:  Activity  Summary of Progress/Problems: Pts played a game of Human Bingo and Unique Qualities.  Roschelle Calandra C Gaye Scorza 01/21/2014, 4:38 PM 

## 2014-01-21 NOTE — [facility] Group Notes (Signed)
[facility] LCSW Group Therapy Note  01/21/2014 /10:00 AM  Type of Therapy and Topic:  Group Therapy: Avoiding Self-Sabotaging and Enabling Behaviors  Participation Level:  Active   Mood: Appropriate  Description of Group:     Learn how to identify obstacles, self-sabotaging and enabling behaviors, what are they, why do we do them and what needs do these behaviors meet? Discuss unhealthy relationships and how to have positive healthy boundaries with those that sabotage and enable. Explore aspects of self-sabotage and enabling in yourself and how to limit these self-destructive behaviors in everyday life.  Therapeutic Goals: 1. Patient will identify one obstacle that relates to self-sabotage and enabling behaviors 2. Patient will identify one personal self-sabotaging or enabling behavior they did prior to admission 3. Patient able to establish a plan to change the above identified behavior they did prior to admission:  4. Patient will demonstrate ability to communicate their needs through discussion and/or role plays.   Summary of Patient Progress: The main focus of today's process group was to explain what "self-sabotage" means and use Motivational Interviewing to discuss what benefits, negative or positive, were involved in a self-identified self-sabotaging behavior. We then talked about reasons the patient may want to change the behavior and current desire to change.Patient were asked to identify where they are are cycle of change chart and asked to share their motivation.  Danielle Villanueva shared during warm up that she is looking forward to getting a new job this year. She was engaged and participated in discussion throughout group. Pt identified herself as being in Action stage of change.    Therapeutic Modalities:   Cognitive Behavioral Therapy Person-Centered Therapy Motivational Interviewing   Carney Bernatherine C Brittany Osier, LCSW

## 2014-01-21 NOTE — Progress Notes (Signed)
Pt asked about room change at this time and reports that she does not wish to move. Pt reports that she can tolerate a cold room and would prefer to remain where she is as she is comfortable with staff and her surroundings. It was explained to pt that she could have the same staff and able to continue programming on [address] and that if she would like to move to be more comfortable at night that could be accommodated for her. Pt still declined offer and said she would prefer to remain on [address].

## 2014-01-22 NOTE — Progress Notes (Signed)
Adult Psychoeducational Group Note  Date:  01/22/2014 Time:  1:23 PM  Group Topic/Focus:  Wellness Toolbox:   The focus of this group is to discuss various aspects of wellness, balancing those aspects and exploring ways to increase the ability to experience wellness.  Patients will create a wellness toolbox for use upon discharge.  Participation Level:  Minimal  Participation Quality:  Appropriate and Attentive  Affect:  Appropriate  Cognitive:  Alert and Oriented  Insight: Good and Improving  Engagement in Group:  Engaged and Improving  Modes of Intervention:  Discussion, Education, Problem-solving, Rapport Building, Socialization and Support  Additional Comments:  Pt sat quietly working on a word search in group but when asked questions pt chimed in offering feedback to patients and sharing herself. When pt was asked what would aid her wellness, pt responded "when I had my career".  Danielle Villanueva [name] 01/22/2014, 1:23 PM

## 2014-01-22 NOTE — [facility] Group Notes (Signed)
[facility] LCSW Group Therapy  01/22/2014 3:12 PM  Type of Therapy:  Group Therapy  Participation Level:  Active  Participation Quality:  Attentive, Sharing and Supportive  Affect:  Appropriate  Cognitive:  Alert and Oriented  Insight:  Developing/Improving  Engagement in Therapy:  Engaged  Modes of Intervention:  Discussion and Exploration  Summary of Progress/Problems:  Group topic today consisted of a discussion around obstacles, specifically fear as an obstacle.  Members were asked to process and use the acronym False, Evidence, Appearing Real as it relates to their obstacles of achieving goals, sobriety, and a future.    Danielle Villanueva was quiet in group and listened mostly to Clinical research associatewriter and other peers, however she did bring an article she was recently reading about Dr. Michele McalpinePhil in which she shared.  She was working to relate to another peer in efforts to support his thoughts on accountability and moving forward in life and not being hinder by obstacles. Danielle Villanueva shows improved insight relating to topic of fear and giving insight to other members with regards to do what you choose and not worrying about what other people say about you.  Danielle SorrowHannah N Kemiah Villanueva 01/22/2014, 3:12 PM

## 2014-01-22 NOTE — [facility] Group Notes (Signed)
[facility] Note   01/22/2014 11:08 AM  Participation Quality:  Active and Engaged  Mood/Affect:  Appropriate  Bright, reporting hopeful  Depression Rating:  3  Anxiety Rating:  3  Thoughts of Suicide:  No Will you contract for safety?   Yes  Current AVH:  No  Plan for Discharge/Comments:  Danielle Villanueva is really interested in attending the [facility] program and has made calls all weekend and on Friday to follow up. Patient has a backup of [facility] as well as the [facility].  Patient reports she is hopeful today and shared scripture with LCSW  Transportation Means: unknown at this time  Supports: unknown at this time.  Raye SorrowHannah N Caidin Heidenreich

## 2014-01-22 NOTE — Progress Notes (Signed)
[facility]  01/22/2014 6:09 PM Danielle Villanueva  MRN:  [id number] Subjective:  Danielle Villanueva continues to have a hard time. States she is trying to keep her hope but that sometimes is really hard. States she would like to be able to go to [facility] and get a part time job and be able to have some money to pay her storage fee so she does not lose all she has. She has not been able to get anyone to help her out and her son communicate trough one of her friends that he does not want anything to do with her. If she was not to able to get the money she will lose "everything she's got" Diagnosis:   DSM5: Schizophrenia Disorders:  none Obsessive-Compulsive Disorders:  none Trauma-Stressor Disorders:  none Substance/Addictive Disorders:  Alcohol Related Disorder - Severe (303.90) Depressive Disorders:  Major Depressive Disorder - Moderate (296.22) Total Time spent with patient: 30 minutes  Axis I: Anxiety Disorder NOS  ADL's:  Intact  Sleep: Fair  Appetite:  Poor  Suicidal Ideation:  Plan:  denies Intent:  denies Means:  denies Homicidal Ideation:  Plan:  denies Intent:  denies Means:  denies AEB (as evidenced by):  Psychiatric Specialty Exam: Physical Exam  ROS  Blood pressure 130/84, pulse 74, temperature 98.1 F (36.7 C), temperature source Oral, resp. rate 16, height 5\' 1"  (1.549 m), weight 48.081 kg (106 lb), SpO2 98.00%.Body mass index is 20.04 kg/(m^2).  General Appearance: Fairly Groomed  Patent attorneyye Contact::  Fair  Speech:  Clear and Coherent  Volume:  Decreased  Mood:  Depressed  Affect:  Depressed and Tearful  Thought Process:  Coherent and Goal Directed  Orientation:  Full (Time, Place, and Person)  Thought Content:  symptoms, worries, concerns  Suicidal Thoughts:  No  Homicidal Thoughts:  No  Memory:  Immediate;   Fair Recent;   Fair Remote;   Fair  Judgement:  Fair  Insight:  Present  Psychomotor Activity:  Restlessness  Concentration:  Fair  Recall:  FiservFair  Fund of  Knowledge:NA  Language: Fair  Akathisia:  No  Handed:    AIMS (if indicated):     Assets:  Desire for Improvement  Sleep:  Number of Hours: 6.5   Musculoskeletal: Strength & Muscle Tone: within normal limits Gait & Station: normal Patient leans: N/A  Current Medications: Current Facility-Administered Medications  Medication Dose Route Frequency Provider Last Rate Last Dose  . acetaminophen (TYLENOL) tablet 650 mg  650 mg Oral Q6H PRN Sanjuana KavaAgnes I Nwoko, NP   650 mg at 01/19/14 1821  . alum & mag hydroxide-simeth (MAALOX/MYLANTA) 200-200-20 MG/5ML suspension 30 mL  30 mL Oral Q4H PRN Nanine MeansJamison Lord, NP      . buPROPion (WELLBUTRIN XL) 24 hr tablet 150 mg  150 mg Oral Daily Nanine MeansJamison Lord, NP   150 mg at 01/22/14 0748  . gabapentin (NEURONTIN) capsule 100 mg  100 mg Oral TID Nanine MeansJamison Lord, NP   100 mg at 01/22/14 1649  . hydrOXYzine (ATARAX/VISTARIL) tablet 25 mg  25 mg Oral Q6H PRN Sanjuana KavaAgnes I Nwoko, NP   25 mg at 01/18/14 2117  . ibuprofen (ADVIL,MOTRIN) tablet 600 mg  600 mg Oral Q6H PRN Beau FannyJohn C Withrow, FNP   600 mg at 01/22/14 1554  . magnesium hydroxide (MILK OF MAGNESIA) suspension 30 mL  30 mL Oral Daily PRN Nanine MeansJamison Lord, NP      . mirtazapine (REMERON) tablet 15 mg  15 mg Oral QHS Nanine MeansJamison Lord,  NP   15 mg at 01/19/14 2138  . multivitamin with minerals tablet 1 tablet  1 tablet Oral Daily Lavena BullionHeather W Baron, RD   1 tablet at 01/22/14 0748  . nicotine (NICODERM CQ - dosed in mg/24 hours) patch 21 mg  21 mg Transdermal Daily Nanine MeansJamison Lord, NP   21 mg at 01/22/14 0748  . ondansetron (ZOFRAN-ODT) disintegrating tablet 4 mg  4 mg Oral Q8H PRN Beau FannyJohn C Withrow, FNP      . traZODone (DESYREL) tablet 50 mg  50 mg Oral QHS PRN Nanine MeansJamison Lord, NP        Lab Results: No results found for this or any previous visit (from the past 48 hour(s)).  Physical Findings: AIMS: Facial and Oral Movements Muscles of Facial Expression: None, normal Lips and Perioral Area: None, normal Jaw: None, normal Tongue: None,  normal,Extremity Movements Upper (arms, wrists, hands, fingers): None, normal Lower (legs, knees, ankles, toes): None, normal, Trunk Movements Neck, shoulders, hips: None, normal, Overall Severity Severity of abnormal movements (highest score from questions above): None, normal Incapacitation due to abnormal movements: None, normal Patient's awareness of abnormal movements (rate only patient's report): No Awareness, Dental Status Current problems with teeth and/or dentures?: No Does patient usually wear dentures?: No  CIWA:  CIWA-Ar Total: 7 COWS:     Treatment Plan Summary: Daily contact with patient to assess and evaluate symptoms and progress in treatment Medication management  Plan: Supportive approach/coping skills/relapse prevention           Help to explore other placement options  Medical Decision Making Problem Points:  Review of psycho-social stressors (1) Data Points:  Review of medication regiment & side effects (2)  I certify that inpatient services furnished can reasonably be expected to improve the patient's condition.   Rachael Feerving A Annaliz Aven 01/22/2014, 6:09 PM

## 2014-01-22 NOTE — Progress Notes (Signed)
Patient ID: Danielle Villanueva, female   DOB: 1951-01-18, 63 y.o.   MRN: [id number] D: patient main complaint this morning is head congestion. She states it is from sleeping in a "cold room."  She was given the option of moving this weekend due to a disagreement with her roommate.  She has agreed to stay and endure the cold temperature.  She rates her depression and hopelessness as a 3.  She is hoping to get into the [facility] program.  She may have an interview today.  Patient presents with flat, blunted affect. She denies any SI/HI/AVH.  She is pleasant this morning, however, at times she is irritable and isolative.  She has been attending groups today.  A: continue to monitor medication management and MD orders.  Safety checks completed every 15 minutes per protocol.  R: patient is receptive to staff.

## 2014-01-23 NOTE — Progress Notes (Signed)
[facility] Group Notes:  (Nursing/MHT/Case Management/Adjunct)  Date:  01/23/2014  Time:  8:00 p.m   Type of Therapy:  Psychoeducational Skills  Participation Level:  Minimal  Participation Quality:  Attentive  Affect:  Appropriate  Cognitive:  Lacking  Insight:  Lacking  Engagement in Group:  Resistant  Modes of Intervention:  Education  Summary of Progress/Problems: The patient had very little to share in group this evening except that she had a good day. She anticipates being discharged possibly tomorrow and would like to finalize her discharge plans.   Westly PamBenjamin S Dehlia Kilner 01/23/2014, 10:38 PM

## 2014-01-23 NOTE — Progress Notes (Signed)
Recreation Therapy Notes  Animal-Assisted Activity/Therapy (AAA/T) Program Checklist/Progress Notes Patient Eligibility Criteria Checklist & Daily Group note for Rec Tx Intervention  Date: 04.21.2015 Time: 2:45pm Location: [address] Dayroom    AAA/T Program Assumption of Risk Form signed by Patient/ or Parent Legal Guardian yes  Patient is free of allergies or sever asthma yes  Patient reports no fear of animals yes  Patient reports no history of cruelty to animals yes   Patient understands his/her participation is voluntary yes  Behavioral Response: Did not attend.   Danielle Villanueva, LRT/CTRS         Danielle Villanueva 01/23/2014 4:30 PM 

## 2014-01-23 NOTE — Progress Notes (Signed)
Adult Psychoeducational Group Note  Date:  01/23/2014 Time:  10:05 AM  Group Topic/Focus:  Recovery Goals:   The focus of this group is to identify appropriate goals for recovery and establish a plan to achieve them.  Participation Level:  None  Participation Quality:  Attentive  Affect:  Appropriate  Cognitive:  Alert  Insight: None  Engagement in Group:  None  Modes of Intervention:  Discussion, Education, Socialization and Support  Additional Comments:  Pt attended group, but did not share.  Berlin HunDavid M Hodges Treiber 01/23/2014, 10:05 AM

## 2014-01-23 NOTE — Progress Notes (Signed)
[facility]  01/23/2014 5:59 PM Danielle Villanueva  MRN:  [id number] Subjective:  Danielle Villanueva continues to have a hard time trying to figure out where to go from here. She states she knows she needs to go to a rehab program also needs to work to get the storage fee paid so she does not lose all her belongings. Continues to have a hard time accepting that her son did not want to help her out or even be supportive of her.  Diagnosis:   DSM5: Schizophrenia Disorders:  none Obsessive-Compulsive Disorders:  none Trauma-Stressor Disorders:  none Substance/Addictive Disorders:  Alcohol Related Disorder - Severe (303.90) Depressive Disorders:  Major Depressive Disorder - Moderate (296.22) Total Time spent with patient: 30 minutes  Axis I: Generalized Anxiety Disorder  ADL's:  Intact  Sleep: Poor  Appetite:  Fair  Suicidal Ideation:  Plan:  denies Intent:  denies Means:  denies Homicidal Ideation:  Plan:  denies Intent:  denies Means:  denies AEB (as evidenced by):  Psychiatric Specialty Exam: Physical Exam  Review of Systems  Constitutional: Negative.   HENT: Negative.   Eyes: Negative.   Respiratory: Negative.   Cardiovascular: Negative.   Gastrointestinal: Negative.   Genitourinary: Negative.   Musculoskeletal: Negative.   Skin: Negative.   Neurological: Negative.   Endo/Heme/Allergies: Negative.   Psychiatric/Behavioral: Positive for depression and substance abuse. The patient is nervous/anxious.     Blood pressure 99/74, pulse 71, temperature 98.2 F (36.8 C), temperature source Oral, resp. rate 18, height 5\' 1"  (1.549 m), weight 48.081 kg (106 lb), SpO2 98.00%.Body mass index is 20.04 kg/(m^2).  General Appearance: Fairly Groomed  Patent attorneyye Contact::  Fair  Speech:  Clear and Coherent and Slow  Volume:  Decreased  Mood:  Depressed  Affect:  Depressed  Thought Process:  Coherent and Goal Directed  Orientation:  Full (Time, Place, and Person)  Thought Content:  symptoms,  worries, concerns  Suicidal Thoughts:  No  Homicidal Thoughts:  No  Memory:  Immediate;   Fair Recent;   Fair Remote;   Fair  Judgement:  Fair  Insight:  Shallow  Psychomotor Activity:  Restlessness  Concentration:  Fair  Recall:  FiservFair  Fund of Knowledge:NA  Language: Fair  Akathisia:  No  Handed:    AIMS (if indicated):     Assets:  Desire for Improvement  Sleep:  Number of Hours: 4.5   Musculoskeletal: Strength & Muscle Tone: within normal limits Gait & Station: normal Patient leans: N/A  Current Medications: Current Facility-Administered Medications  Medication Dose Route Frequency Provider Last Rate Last Dose  . acetaminophen (TYLENOL) tablet 650 mg  650 mg Oral Q6H PRN Sanjuana KavaAgnes I Nwoko, NP   650 mg at 01/19/14 1821  . alum & mag hydroxide-simeth (MAALOX/MYLANTA) 200-200-20 MG/5ML suspension 30 mL  30 mL Oral Q4H PRN Nanine MeansJamison Lord, NP      . buPROPion (WELLBUTRIN XL) 24 hr tablet 150 mg  150 mg Oral Daily Nanine MeansJamison Lord, NP   150 mg at 01/23/14 0811  . gabapentin (NEURONTIN) capsule 100 mg  100 mg Oral TID Nanine MeansJamison Lord, NP   100 mg at 01/23/14 1702  . hydrOXYzine (ATARAX/VISTARIL) tablet 25 mg  25 mg Oral Q6H PRN Sanjuana KavaAgnes I Nwoko, NP   25 mg at 01/18/14 2117  . ibuprofen (ADVIL,MOTRIN) tablet 600 mg  600 mg Oral Q6H PRN Beau FannyJohn C Withrow, FNP   600 mg at 01/23/14 1536  . magnesium hydroxide (MILK OF MAGNESIA) suspension 30 mL  30 mL Oral Daily PRN Nanine MeansJamison Lord, NP      . mirtazapine (REMERON) tablet 15 mg  15 mg Oral QHS Nanine MeansJamison Lord, NP   15 mg at 01/19/14 2138  . multivitamin with minerals tablet 1 tablet  1 tablet Oral Daily Lavena BullionHeather W Baron, RD   1 tablet at 01/23/14 [phone]  . nicotine (NICODERM CQ - dosed in mg/24 hours) patch 21 mg  21 mg Transdermal Daily Nanine MeansJamison Lord, NP   21 mg at 01/23/14 0815  . ondansetron (ZOFRAN-ODT) disintegrating tablet 4 mg  4 mg Oral Q8H PRN Beau FannyJohn C Withrow, FNP      . traZODone (DESYREL) tablet 50 mg  50 mg Oral QHS PRN Nanine MeansJamison Lord, NP   50 mg at  01/23/14 0003    Lab Results: No results found for this or any previous visit (from the past 48 hour(s)).  Physical Findings: AIMS: Facial and Oral Movements Muscles of Facial Expression: None, normal Lips and Perioral Area: None, normal Jaw: None, normal Tongue: None, normal,Extremity Movements Upper (arms, wrists, hands, fingers): None, normal Lower (legs, knees, ankles, toes): None, normal, Trunk Movements Neck, shoulders, hips: None, normal, Overall Severity Severity of abnormal movements (highest score from questions above): None, normal Incapacitation due to abnormal movements: None, normal Patient's awareness of abnormal movements (rate only patient's report): No Awareness, Dental Status Current problems with teeth and/or dentures?: No Does patient usually wear dentures?: No  CIWA:  CIWA-Ar Total: 7 COWS:     Treatment Plan Summary: Daily contact with patient to assess and evaluate symptoms and progress in treatment Medication management  Plan: Supportive approach/coping skills/relapse prevention           Explore placement options  Medical Decision Making Problem Points:  Review of psycho-social stressors (1) Data Points:  Review of medication regiment & side effects (2)  I certify that inpatient services furnished can reasonably be expected to improve the patient's condition.   Danielle Villanueva 01/23/2014, 5:59 PM

## 2014-01-23 NOTE — Progress Notes (Signed)
Patient ID: Danielle Villanueva F Maisel, female   DOB: 1950-10-29, 63 y.o.   MRN: [id number] D: patient has been pleasant and cooperative.  She is interacting well with staff and her peers.  She continues to work on a Engineer, materialsjigsaw puzzle, engaging her peers to help.  She has been attending groups with minimal participation, however, she is supportive of her peers.  Patient is still hoping to get a bed at [facility] or [facility].  Her discharge date has not been discussed as of yet.  She denies any SI/HI/AVH. She rates her depression symptoms as a 3/4; hopelessness as a 3/4.  She states she still has "mental cravings."  A: continue to monitor medication management and MD orders.  Safety checks completed every 15 minutes per protocol.  R: patient is receptive to staff.

## 2014-01-23 NOTE — Progress Notes (Addendum)
Patient ID: Danielle Villanueva, female   DOB: November 05, 1950, 63 y.o.   MRN: [id number]  D: Pt engaged the writer in conversation today as apposed to previous days. Pt informed the writer that [facility] but [facility]   A:  Support and encouragement was offered. 15 min checks continued for safety.  R: Pt remains safe.   Addendum: Pt refused her scheduled remeron, then came up at 2400 to request sleep.

## 2014-01-23 NOTE — [facility] Group Notes (Signed)
[facility] LCSW Group Therapy  01/23/2014 1:59 PM  Type of Therapy:  Group Therapy  Participation Level:  Active  Participation Quality:  Attentive  Affect:  Appropriate  Cognitive:  Alert and Oriented  Insight:  Developing/Improving  Engagement in Therapy:  Engaged  Modes of Intervention:  Discussion, Education and Exploration  Summary of Progress/Problems:  Group today consisted of Mental [employer] speaker discussing his personal story related to recovery with substance abuse and mental health.  Danielle Villanueva attended the session, listened attentively and engaged with speaker.    Danielle Villanueva 01/23/2014, 1:59 PM

## 2014-01-24 MED ORDER — AZITHROMYCIN 250 MG PO TABS
500.0000 mg | ORAL_TABLET | Freq: Every day | ORAL | Status: DC
  Administered 2014-01-24 – 2014-01-25 (×2): 500 mg via ORAL
  Filled 2014-01-24 (×3): qty 1

## 2014-01-24 NOTE — Progress Notes (Signed)
D: Patient in the dayroom on approach.  Patient states she had a good day but states she is catching a cold.  Patient states she is happy because she was started on a mediation to help with her cold.  Patient states she has been in a good mood.  Patient denies SI/HI and denies AVH.   A: Staff to monitor Q 15 mins for safety.  Encouragement and support offered.  Scheduled medications administered per orders. R: Patient remains safe on the unit.  Patient attended group tonight.  Patient visible on the unit and interacting with peers.  Patient taking administered medications.

## 2014-01-24 NOTE — Progress Notes (Signed)
LCSW continues to seek placement for patient at [facility]. LCSW has left messages since Thursday with no response. LCSW spoke with manager of [facility], awaiting call back from maggie from referral services.    Another message left today around 10:30am.  Will follow up.  Ashley JacobsHannah Nail, MSW, LCSW Clinical Lead [phone]

## 2014-01-24 NOTE — [facility] Group Notes (Signed)
[facility]   01/24/2014 10:08 AM  Participation Quality:  Active and Engaged  Mood/Affect:  Appropriate  Bright and Full  Depression Rating:  denied  Anxiety Rating:  denied  Thoughts of Suicide:  No Will you contract for safety?   Yes  Current AVH:  No  Plan for Discharge/Comments:  Patient continues to want the [facility] program for inpatient substance abuse treatment.   [facility] and will go to either treatment facility.  Transportation Means: unknown at this time  Supports: friends  Raye SorrowHannah N Rickia [place]burg

## 2014-01-24 NOTE — Progress Notes (Signed)
[facility]  01/24/2014 5:48 PM Danielle Villanueva  MRN:  [id number] Subjective:  Very anxious, overwhelmed as she has not heard anything about her placement and anticipates losing everything she's got if she does not come with the money. States she cant stop thinking about this. Tries to hang to what little hope she has, but states it is hard Diagnosis:   DSM5: Schizophrenia Disorders:  none Obsessive-Compulsive Disorders:  none Trauma-Stressor Disorders:  none Substance/Addictive Disorders:  Alcohol Related Disorder - Severe (303.90) Depressive Disorders:  Major Depressive Disorder - Moderate (296.22) Total Time spent with patient: 30 minutes  Axis I: Anxiety Disorder NOS  ADL's:  Intact  Sleep: Fair  Appetite:  Poor  Suicidal Ideation:  Plan:  denies Intent:  denies Means:  denies Homicidal Ideation:  Plan:  denies Intent:  denies Means:  denies AEB (as evidenced by):  Psychiatric Specialty Exam: Physical Exam  Review of Systems  Constitutional: Positive for malaise/fatigue.  HENT: Positive for congestion.        Sinus infection  Eyes: Negative.   Cardiovascular: Negative.   Gastrointestinal: Negative.   Genitourinary: Negative.   Musculoskeletal: Negative.   Skin: Negative.   Neurological: Positive for weakness.  Endo/Heme/Allergies: Negative.   Psychiatric/Behavioral: Positive for depression and substance abuse. The patient is nervous/anxious.     Blood pressure 96/65, pulse 71, temperature 97.6 F (36.4 C), temperature source Oral, resp. rate 20, height 5\' 1"  (1.549 m), weight 48.081 kg (106 lb), SpO2 98.00%.Body mass index is 20.04 kg/(m^2).  General Appearance: Fairly Groomed  Patent attorneyye Contact::  Fair  Speech:  Clear and Coherent and Slow  Volume:  Decreased  Mood:  Depressed  Affect:  Restricted  Thought Process:  Coherent and Goal Directed  Orientation:  Full (Time, Place, and Person)  Thought Content:  symptoms, worries, concerns  Suicidal Thoughts:   No  Homicidal Thoughts:  No  Memory:  Immediate;   Fair Recent;   Fair Remote;   Fair  Judgement:  Fair  Insight:  Present  Psychomotor Activity:  Restlessness  Concentration:  Fair  Recall:  FiservFair  Fund of Knowledge:NA  Language: Fair  Akathisia:  No  Handed:    AIMS (if indicated):     Assets:  Desire for Improvement  Sleep:  Number of Hours: 6.5   Musculoskeletal: Strength & Muscle Tone: within normal limits Gait & Station: normal Patient leans: N/A  Current Medications: Current Facility-Administered Medications  Medication Dose Route Frequency Provider Last Rate Last Dose  . acetaminophen (TYLENOL) tablet 650 mg  650 mg Oral Q6H PRN Sanjuana KavaAgnes I Nwoko, NP   650 mg at 01/19/14 1821  . alum & mag hydroxide-simeth (MAALOX/MYLANTA) 200-200-20 MG/5ML suspension 30 mL  30 mL Oral Q4H PRN Nanine MeansJamison Lord, NP      . azithromycin (ZITHROMAX) tablet 500 mg  500 mg Oral Daily Rachael FeeIrving A Ellena Kamen, MD   500 mg at 01/24/14 1409  . buPROPion (WELLBUTRIN XL) 24 hr tablet 150 mg  150 mg Oral Daily Nanine MeansJamison Lord, NP   150 mg at 01/24/14 0826  . gabapentin (NEURONTIN) capsule 100 mg  100 mg Oral TID Nanine MeansJamison Lord, NP   100 mg at 01/24/14 1629  . hydrOXYzine (ATARAX/VISTARIL) tablet 25 mg  25 mg Oral Q6H PRN Sanjuana KavaAgnes I Nwoko, NP   25 mg at 01/18/14 2117  . ibuprofen (ADVIL,MOTRIN) tablet 600 mg  600 mg Oral Q6H PRN Beau FannyJohn C Withrow, FNP   600 mg at 01/24/14 1106  . magnesium  hydroxide (MILK OF MAGNESIA) suspension 30 mL  30 mL Oral Daily PRN Nanine MeansJamison Lord, NP      . mirtazapine (REMERON) tablet 15 mg  15 mg Oral QHS Nanine MeansJamison Lord, NP   15 mg at 01/23/14 2135  . multivitamin with minerals tablet 1 tablet  1 tablet Oral Daily Lavena BullionHeather W Baron, RD   1 tablet at 01/24/14 0826  . nicotine (NICODERM CQ - dosed in mg/24 hours) patch 21 mg  21 mg Transdermal Daily Nanine MeansJamison Lord, NP   21 mg at 01/24/14 [id number]  . ondansetron (ZOFRAN-ODT) disintegrating tablet 4 mg  4 mg Oral Q8H PRN Beau FannyJohn C Withrow, FNP      . traZODone (DESYREL)  tablet 50 mg  50 mg Oral QHS PRN Nanine MeansJamison Lord, NP   50 mg at 01/23/14 0003    Lab Results: No results found for this or any previous visit (from the past 48 hour(s)).  Physical Findings: AIMS: Facial and Oral Movements Muscles of Facial Expression: None, normal Lips and Perioral Area: None, normal Jaw: None, normal Tongue: None, normal,Extremity Movements Upper (arms, wrists, hands, fingers): None, normal Lower (legs, knees, ankles, toes): None, normal, Trunk Movements Neck, shoulders, hips: None, normal, Overall Severity Severity of abnormal movements (highest score from questions above): None, normal Incapacitation due to abnormal movements: None, normal Patient's awareness of abnormal movements (rate only patient's report): No Awareness, Dental Status Current problems with teeth and/or dentures?: No Does patient usually wear dentures?: No  CIWA:  CIWA-Ar Total: 7 COWS:     Treatment Plan Summary: Daily contact with patient to assess and evaluate symptoms and progress in treatment Medication management  Plan: Supportive approach/coping skills/relapse prevention           Problem solving/explore other placement options           Address the sinusitis: Zithromax  Medical Decision Making Problem Points:  Review of psycho-social stressors (1), New problem (sinusitis) Data Points:  Review of new medications or change in dosage (2)  I certify that inpatient services furnished can reasonably be expected to improve the patient's condition.   Rachael Feerving A Markitta Ausburn 01/24/2014, 5:48 PM

## 2014-01-24 NOTE — Progress Notes (Signed)
Patient ID: Danielle Villanueva, female   DOB: 09-19-1951, 63 y.o.   MRN: [id number] D: Patient states she has not felt well today.  She has some head congestion and a nonproductive cough.  Patient reports, "I feel miserable."  Zithromax was ordered for symptoms.  Patient reports fair sleep and good appetite; her energy is low.  She rates her depression as a 3-4 and her hopelessness as a 3-4.  Patient has been attending groups with minimal participation.  She interacts well with her peers and she is supportive.  She denies any SI/HI/AVH.  A: continue to monitor medication management and MD orders. Safety checks completed every 15 minutes per protocol.  R: Patient is receptive to staff; her behavior is appropriate.

## 2014-01-24 NOTE — [facility] Group Notes (Signed)
[facility] LCSW Group Therapy  01/24/2014 2:29 PM  Type of Therapy:  Group Therapy  Participation Level:  None  Participation Quality:  Drowsy  Affect:  Lethargic  Cognitive:  Alert and Oriented  Insight:  Limited  Engagement in Therapy:  Lacking  Modes of Intervention:  Discussion, Exploration and Support  Summary of Progress/Problems:  The purpose of this group is to assist patients in learning to regulate negative emotions and experience positive emotions. Patients will be guided to discuss ways in which they have been vulnerable to their negative emotions. These vulnerabilities will be juxtaposed with experiences of positive emotions or situations, and patients challenged to use positive emotions to combat negative ones.   Danielle Villanueva slept through most of group, woke up occasionally, but did not invest or add to the discussion or topic.  Raye SorrowHannah N Valinda Fedie 01/24/2014, 2:29 PM

## 2014-01-25 MED ORDER — IBUPROFEN 200 MG PO TABS: 400.0000 mg | ORAL_TABLET | Freq: Four times a day (QID) | ORAL | Status: AC | PRN

## 2014-01-25 MED ORDER — BUPROPION HCL ER (XL) 150 MG PO TB24: 150.0000 mg | ORAL_TABLET | Freq: Every day | ORAL | Status: AC

## 2014-01-25 MED ORDER — IBUPROFEN 400 MG PO TABS
400.0000 mg | ORAL_TABLET | Freq: Four times a day (QID) | ORAL | Status: DC | PRN
  Filled 2014-01-25: qty 56

## 2014-01-25 MED ORDER — HYDROXYZINE HCL 25 MG PO TABS
25.0000 mg | ORAL_TABLET | Freq: Three times a day (TID) | ORAL | Status: DC | PRN
  Filled 2014-01-25: qty 42

## 2014-01-25 MED ORDER — AZITHROMYCIN 500 MG PO TABS: 500.0000 mg | ORAL_TABLET | Freq: Every day | ORAL | Status: AC

## 2014-01-25 MED ORDER — HYDROXYZINE HCL 25 MG PO TABS: ORAL_TABLET | ORAL | Status: AC

## 2014-01-25 MED ORDER — TRAZODONE HCL 50 MG PO TABS: 50.0000 mg | ORAL_TABLET | Freq: Every evening | ORAL | Status: AC | PRN

## 2014-01-25 MED ORDER — GABAPENTIN 100 MG PO CAPS: 100.0000 mg | ORAL_CAPSULE | Freq: Three times a day (TID) | ORAL | Status: AC

## 2014-01-25 MED ORDER — MIRTAZAPINE 15 MG PO TABS: 15.0000 mg | ORAL_TABLET | Freq: Every day | ORAL | Status: AC

## 2014-01-25 NOTE — Progress Notes (Signed)
[facility].  She denied any S/I H/I or A/V hallucinations. She was given f/u appointment, rx, sample medications,and hotline info booklet.  He voiced understanding to all instructions provided.  She declined the need for smoking cessation materials. She removed her nicotine patch before she left.

## 2014-01-25 NOTE — [facility] Group Notes (Signed)
[facility] LCSW Group Therapy  01/25/2014 2:50 PM  Type of Therapy:  Group Therapy  Participation Level:  Minimal   (discharging home during this time)   Summary of Progress/Problems:  This group will allow patients to explore their thoughts and feelings about diagnoses they have received. Patients will be guided to explore their level of understanding and acceptance of these diagnoses. Facilitator will encourage patients to process their thoughts and feelings about the reactions of others to their diagnosis, and will guide patients in identifying ways to discuss their diagnosis with significant others in their lives.  Raye SorrowHannah N Robin Pafford 01/25/2014, 2:50 PM

## 2014-01-25 NOTE — Progress Notes (Addendum)
Updated: [facility] today. Patient will be picked up by [facility] at 2pm today.  LCSW has notified patient and treatment team and all in agreement.    LCSW has received call back from [facility] reporting there are no female beds at this time. Patient can remain on the wait list for 1 month and call each day after DC to be considered for [facility] placement.  Patient was updated on information, reports she has the number and will call.  In the meantime of waiting, LCSW also called [facility].  [facility] will still accept patient, possibly on Friday 4/24 for treatment. Patient is agreeable to this plan.  LCSW will confirm with [facility] today on plans for DC.  Ashley JacobsHannah Nail, MSW, LCSW Clinical Lead [phone]

## 2014-01-25 NOTE — [facility] Suicide Risk Assessment (Signed)
Suicide Risk Assessment  Discharge Assessment     Demographic Factors:  Caucasian  Total Time spent with patient: 45 minutes  Psychiatric Specialty Exam:     Blood pressure 120/83, pulse 74, temperature 98.1 F (36.7 C), temperature source Oral, resp. rate 16, height 5\' 1"  (1.549 m), weight 48.081 kg (106 lb), SpO2 98.00%.Body mass index is 20.04 kg/(m^2).  General Appearance: Fairly Groomed  Patent attorneyye Contact::  Fair  Speech:  Clear and Coherent  Volume:  Normal  Mood:  worried  Affect:  anxious, worried  Thought Process:  Coherent and Goal Directed  Orientation:  Full (Time, Place, and Person)  Thought Content:  worries, and concerns  Suicidal Thoughts:  No  Homicidal Thoughts:  No  Memory:  Immediate;   Fair Recent;   Fair Remote;   Fair  Judgement:  Fair  Insight:  Present  Psychomotor Activity:  Restlessness  Concentration:  Fair  Recall:  FiservFair  Fund of Knowledge:Fair  Language: Fair  Akathisia:  No  Handed:    AIMS (if indicated):     Assets:  Desire for Improvement  Sleep:  Number of Hours: 6.25    Musculoskeletal: Strength & Muscle Tone: within normal limits Gait & Station: normal Patient leans: N/A   Mental Status Per Nursing Assessment::   On Admission:     Current Mental Status by Physician: In full contact with reality. There are no active suicidal ideas, plans or intent. No active S/S of withdrawal. She is willing and motivated to pursue further work trough [facility]   Loss Factors: Financial problems/change in socioeconomic status  Historical Factors: NA  Risk Reduction Factors:   NA  Continued Clinical Symptoms:  Alcohol/Substance Abuse/Dependencies  Cognitive Features That Contribute To Risk:  Closed-mindedness Polarized thinking Thought constriction (tunnel vision)    Suicide Risk:  Minimal: No identifiable suicidal ideation.  Patients presenting with no risk factors but with morbid ruminations; may be classified as minimal risk based on  the severity of the depressive symptoms  Discharge Diagnoses:   AXIS I:  Alcohol Dependence, GAD, Substance Induced Mood Disorder AXIS II:  Deferred AXIS III:   Past Medical History  Diagnosis Date  . Depression   . Anxiety    AXIS IV:  other psychosocial or environmental problems AXIS V:  61-70 mild symptoms  Plan Of Care/Follow-up recommendations:  Activity:  As tolerated Diet:  regular Follow up [facility] Is patient on multiple antipsychotic therapies at discharge:  No   Has Patient had three or more failed trials of antipsychotic monotherapy by history:  No  Recommended Plan for Multiple Antipsychotic Therapies: NA    Rachael FeeIrving A Kaladin Noseworthy 01/25/2014, 2:40 PM

## 2014-01-25 NOTE — Discharge Summary (Signed)
Physician Discharge Summary Note  Patient:  Danielle Villanueva is an 63 y.o., female MRN:  [id number] DOB:  20-Feb-1951 Patient phone:  [phone] (home)  Patient address:   [address] [place] [address],  Total Time spent with patient: Greater than 30 minutes  Date of Admission:  01/15/2014  Date of Discharge: 01/25/14  Reason for Admission: Alcohol detox  Discharge Diagnoses: Active Problems:   Alcohol dependence   Suicidal ideation   Psychiatric Specialty Exam: Physical Exam  Psychiatric: Her speech is normal and behavior is normal. Judgment and thought content normal. Her mood appears not anxious. Her affect is not angry, not blunt, not labile and not inappropriate. Cognition and memory are normal. She does not exhibit a depressed mood.    Review of Systems  Constitutional: Negative.   HENT: Negative.   Eyes: Negative.   Respiratory: Negative.   Cardiovascular: Negative.   Gastrointestinal: Negative.   Genitourinary: Negative.   Musculoskeletal: Negative.   Skin: Negative.   Neurological: Negative.   Endo/Heme/Allergies: Negative.   Psychiatric/Behavioral: Positive for depression (Stabilized with medication prior to discharge) and substance abuse (Alcohol dependence). Negative for suicidal ideas, hallucinations and memory loss. The patient is nervous/anxious and has insomnia (Stabilized with medication prior to discharge).     Blood pressure 120/83, pulse 74, temperature 98.1 F (36.7 C), temperature source Oral, resp. rate 16, height 5\' 1"  (1.549 m), weight 48.081 kg (106 lb), SpO2 98.00%.Body mass index is 20.04 kg/(m^2).    Past Psychiatric History: Diagnosis:  Alcohol dependence, Substance induced mood disorder  Hospitalizations:  [facility] adult clinic  Outpatient Care: [facility]  Substance Abuse Care: [facility] [place]  Self-Mutilation: NA  Suicidal Attempts: NA  Violent Behaviors: NA   Musculoskeletal: Strength & Muscle Tone: within normal limits Gait  & Station: normal Patient leans: N/A  DSM5: Schizophrenia Disorders:  NA Obsessive-Compulsive Disorders:  NA Trauma-Stressor Disorders:  NA Substance/Addictive Disorders:  Alcohol Related Disorder - Severe (303.90) Depressive Disorders:  Substance induced mood disorder  Axis Diagnosis:  AXIS I:   Alcohol dependence, Substance induced mood disorder AXIS II:  Deferred AXIS III:   Past Medical History  Diagnosis Date  . Depression   . Anxiety    AXIS IV:  economic problems, housing problems, occupational problems and Alcoholism, chronic AXIS V:  60  Level of Care:  [facility]  Hospital Course:  Danielle Villanueva reports, "I was asked to leave [facility] on my 10th day of the substance abuse treatment. I was accused of breaking the patient's confidentiality. There was a girl at the treatment center who was trying to become a man. They said that I had a fight with her and that was a lie. I don't fight with people. I was asked to leave the center same day. I went to the [facility] tx center because my son/daughter promised me to go to [facility] and they will pay my storage fees. They broke their promises to me. They declined to pay my storage fees citing financial stressors.   Danielle Villanueva was admitted to the hospital this time with a blood alcohol level of 224. She was intoxicated, requiring detox treatment.  She was initially started on the Librium detox protocols. And because her liver functions were already compromised by alcoholism per her elevated liver enzymes, Danielle Villanueva was did not receive Librium detox protocols.  Ativan detox regimen on a tapering dose format was used instead. She was also medicated with Wellbutrin XL 150 mg daily for depression, Remeron 15 mg Q  bedtime for depression/insomnia and appetite stimulant. She also was ordered and received Neurontin 100 mg three times daily for substance withdrawal syndrome. She was enrolled in the group sessions, AA/NA meetings being offered and held on  this unit. She learned coping skills. Danielle Villanueva was recommended [facility]  residential treatment center as a part of her continuation therapy after discharge.   Danielle Villanueva has completed detox treatment and her mood stabilized. She is currently being discharged to continue substance abuse treatment at the [facility] [place]. And for medication management/routine psychiatric care, she will receive these serivices at the [facility] [place], [place]. She has been provided with all the pertinent information required to make these appointments without problems. Danielle Villanueva received a 14 days worth supply samples of her [facility]. Upon discharge, she adamantly denies any SIHI, AVH, delusional thoughts, paranoia and or withdrawal symptoms. [facility] in no distress. [facility].   Consults:  psychiatry  Significant Diagnostic Studies:  labs: CBC with diff, CMP, UDS, toxicology tests, U/A  Discharge Vitals:   Blood pressure 120/83, pulse 74, temperature 98.1 F (36.7 C), temperature source Oral, resp. rate 16, height 5\' 1"  (1.549 m), weight 48.081 kg (106 lb), SpO2 98.00%. Body mass index is 20.04 kg/(m^2). Lab Results:   No results found for this or any previous visit (from the past 72 hour(s)).  Physical Findings: AIMS: Facial and Oral Movements Muscles of Facial Expression: None, normal Lips and Perioral Area: None, normal Jaw: None, normal Tongue: None, normal,Extremity Movements Upper (arms, wrists, hands, fingers): None, normal Lower (legs, knees, ankles, toes): None, normal, Trunk Movements Neck, shoulders, hips: None, normal, Overall Severity Severity of abnormal movements (highest score from questions above): None, normal Incapacitation due to abnormal movements: None, normal Patient's awareness of abnormal movements (rate only patient's report): No Awareness, Dental Status Current problems with teeth and/or  dentures?: No Does patient usually wear dentures?: No  CIWA:  CIWA-Ar Total: 7 COWS:     Psychiatric Specialty Exam: See Psychiatric Specialty Exam and Suicide Risk Assessment completed by Attending Physician prior to discharge.  Discharge destination:  [facility]  Is patient on multiple antipsychotic therapies at discharge:  No   Has Patient had three or more failed trials of antipsychotic monotherapy by history:  No  Recommended Plan for Multiple Antipsychotic Therapies: NA    Medication List       Indication   azithromycin 500 MG tablet  Commonly known as:  ZITHROMAX  Take 1 tablet (500 mg total) by mouth daily. For infection   Indication:  Infection     buPROPion 150 MG 24 hr tablet  Commonly known as:  WELLBUTRIN XL  Take 1 tablet (150 mg total) by mouth daily. For depression   Indication:  Major Depressive Disorder     gabapentin 100 MG capsule  Commonly known as:  NEURONTIN  Take 1 capsule (100 mg total) by mouth 3 (three) times daily. For substance withdrawal syndrome   Indication:  Alcohol Withdrawal Syndrome     hydrOXYzine 25 MG tablet  Commonly known as:  ATARAX/VISTARIL  Take 25 mg (1 tablet) three times daily as needed for anxiety   Indication:  Tension, Anxiety     ibuprofen 200 MG tablet  Commonly known as:  ADVIL,MOTRIN  Take 2 tablets (400 mg total) by mouth every 6 (six) hours as needed for moderate pain.   Indication:  Mild to Moderate Pain     mirtazapine  15 MG tablet  Commonly known as:  REMERON  Take 1 tablet (15 mg total) by mouth at bedtime. For depression   Indication:  Trouble Sleeping, Major Depressive Disorder     traZODone 50 MG tablet  Commonly known as:  DESYREL  Take 1 tablet (50 mg total) by mouth at bedtime as needed for sleep.   Indication:  Trouble Sleeping       Follow-up Information   Follow up with [facility] On 01/25/2014. (Admission into [facility]. They will pick you up at 2:00pm.)    Contact information:   [address] [place], [place] [address] [phone]      Schedule an appointment as soon as possible for a visit with [facility]. (After completion of inpatient treatment please follow up with [facility] to manage and supply your medications. You can walk in any time Monday-Friday 8am-3pm.)    Contact information:   [address]  [place], [place] [address] [phone]     Follow-up recommendations: Activity:  As tolerated Diet: As recommended by your primary care doctor. Keep all scheduled follow-up appointments as recommended.    Comments:  Take all your medications as prescribed by your mental healthcare provider. Report any adverse effects and or reactions from your medicines to your outpatient provider promptly. Patient is instructed and cautioned to not engage in alcohol and or illegal drug use while on prescription medicines. In the event of worsening symptoms, patient is instructed to call the crisis hotline, 911 and or go to the nearest ED for appropriate evaluation and treatment of symptoms. Follow-up with your primary care provider for your other medical issues, concerns and or health care needs.   Total Discharge Time:  Greater than 30 minutes.  Signed: Sanjuana Kavagnes I Nwoko, PMHNP-BC 01/25/2014, 2:34 PM Personally evaluated the patient, and agree with assessment and plan Madie RenoIrving A. Dub MikesLugo, M.D.

## 2014-01-25 NOTE — Tx Team (Addendum)
Interdisciplinary Treatment Plan Update (Adult)  Date: 01/24/14  Time Reviewed:  9:45 AM  Progress in Treatment: Attending groups: Yes Participating in groups:  Yes Taking medication as prescribed:  Yes Tolerating medication:  Yes Family/Significant othe contact made: No, attempts were made Patient understands diagnosis:  Yes Discussing patient identified problems/goals with staff:  Yes Medical problems stabilized or resolved:  Yes Denies suicidal/homicidal ideation: Yes Issues/concerns per patient self-inventory:  Yes Other:  New problem(s) identified: N/A  Discharge Plan or Barriers: Patient is on the waitlist at [facility] with no female beds. Patient open to go to [facility] in the mean time.   Reason for Continuation of Hospitalization: Goals met.  Comments: N/A  Estimated length of stay: DC today.  For review of initial/current patient goals, please see plan of care.  Attendees: Patient:     Family:     Physician:  Dr. Sabra Heck 01/24/14  Danielle Villanueva  Nursing:   Chrys Racer, RN   Clinical Social Worker:  Vidal Schwalbe, LCSW   Other: Lars Pinks, RN case manager   Other:  Agustina Caroli, NP   Other:     Other:     Other:    Other:    Other:    Other:    Other:    Other:     Scribe for Treatment Team:   Vidal Schwalbe, LCSW, 01/25/2014 , 10:48 AM

## 2014-01-25 NOTE — Progress Notes (Signed)
0900 nursing orientation group   The focus of this group is to educate the patient on the purpose and policies of crisis stabilization and provide a format to answer questions about their admission.  The group details unit policies and expectations of patients while admitted.   Pt was an active participant in group pt stated,"my goal today is to leave for [facility] and my family and my grands make me happy"

## 2014-01-25 NOTE — Progress Notes (Signed)
[facility] Discharge Plan :  Will you be returning to the same living situation after discharge: No.  Going to long term treatment [facility] At discharge, do you have transportation home?:Yes,  [facility] will pick patient up Do you have the ability to pay for your medications:Yes,  no barriers, samples given  Release of information consent forms completed and in the chart;  Patient's signature needed at discharge.  Patient to Follow up at: Follow-up Information   Follow up with [facility] On 01/25/2014. (Admission into [facility]. They will pick you up at 2:00pm.)    Contact information:   [address] [place], [place] [address] [phone]      Schedule an appointment as soon as possible for a visit with [facility]. (After completion of inpatient treatment please follow up with [facility] to manage and supply your medications. You can walk in any time Monday-Friday 8am-3pm.)    Contact information:   [address]  [place], [place] [address] [phone]      Patient denies SI/HI:   Yes,  no reports of SI    Safety Planning and Suicide Prevention discussed:  Yes,  completed with patient, see SI note.  Danielle SorrowHannah Villanueva Danielle Villanueva 01/25/2014, 10:21 AM

## 2014-01-30 NOTE — Progress Notes (Signed)
Patient Discharge Instructions:  After Visit Summary (AVS):   Faxed to:  01/30/14 Discharge Summary Note:   Faxed to:  01/30/14 Psychiatric Admission Assessment Note:   Faxed to:  01/30/14 Suicide Risk Assessment - Discharge Assessment:   Faxed to:  01/30/14 Faxed/Sent to the Next Level Care provider:  01/30/14 Faxed to [facility] @ [phone]  Jerelene ReddenSheena E Westway, 01/30/2014, 3:22 PM
